# Patient Record
Sex: Female | Born: 1980 | Race: Asian | Hispanic: No | Marital: Married | Smoking: Never smoker
Health system: Southern US, Community
[De-identification: ages and names within clinical notes are randomized; demographics above are authoritative.]

## PROBLEM LIST (undated history)

## (undated) DIAGNOSIS — H469 Unspecified optic neuritis: Secondary | ICD-10-CM

## (undated) DIAGNOSIS — N83209 Unspecified ovarian cyst, unspecified side: Secondary | ICD-10-CM

## (undated) DIAGNOSIS — K3184 Gastroparesis: Secondary | ICD-10-CM

## (undated) HISTORY — PX: TONSILLECTOMY: SUR1361

## (undated) HISTORY — DX: Unspecified optic neuritis: H46.9

## (undated) HISTORY — DX: Gastroparesis: K31.84

---

## 2003-06-01 HISTORY — PX: TUBAL LIGATION: SHX77

## 2003-07-25 ENCOUNTER — Ambulatory Visit (HOSPITAL_COMMUNITY): Admission: RE | Admit: 2003-07-25 | Discharge: 2003-07-25 | Payer: Self-pay | Admitting: *Deleted

## 2003-09-02 ENCOUNTER — Ambulatory Visit (HOSPITAL_COMMUNITY): Admission: RE | Admit: 2003-09-02 | Discharge: 2003-09-02 | Payer: Self-pay | Admitting: *Deleted

## 2004-01-17 ENCOUNTER — Ambulatory Visit (HOSPITAL_COMMUNITY): Admission: RE | Admit: 2004-01-17 | Discharge: 2004-01-17 | Payer: Self-pay | Admitting: *Deleted

## 2004-01-28 ENCOUNTER — Ambulatory Visit: Payer: Self-pay | Admitting: Obstetrics and Gynecology

## 2004-01-28 ENCOUNTER — Encounter (INDEPENDENT_AMBULATORY_CARE_PROVIDER_SITE_OTHER): Payer: Self-pay | Admitting: Specialist

## 2004-01-28 ENCOUNTER — Inpatient Hospital Stay (HOSPITAL_COMMUNITY): Admission: AD | Admit: 2004-01-28 | Discharge: 2004-01-30 | Payer: Self-pay | Admitting: *Deleted

## 2006-05-31 DIAGNOSIS — K3184 Gastroparesis: Secondary | ICD-10-CM

## 2006-05-31 HISTORY — DX: Gastroparesis: K31.84

## 2007-01-07 ENCOUNTER — Emergency Department: Payer: Self-pay | Admitting: Emergency Medicine

## 2007-01-09 ENCOUNTER — Inpatient Hospital Stay (HOSPITAL_COMMUNITY): Admission: AD | Admit: 2007-01-09 | Discharge: 2007-01-15 | Payer: Self-pay | Admitting: Internal Medicine

## 2007-01-09 ENCOUNTER — Ambulatory Visit: Payer: Self-pay | Admitting: Family Medicine

## 2007-01-25 ENCOUNTER — Ambulatory Visit: Payer: Self-pay | Admitting: Internal Medicine

## 2010-10-13 NOTE — H&P (Signed)
[name], [name]                ACCOUNT NO.:  [id number]   MEDICAL RECORD NO.:  [id number]          PATIENT TYPE:  INP   LOCATION:  5705                         FACILITY:  [facility]   PHYSICIAN:  Leighton Roach McDiarmid, M.D.DATE OF BIRTH:  August 13, 1980   DATE OF ADMISSION:  01/09/2007  DATE OF DISCHARGE:                              HISTORY & PHYSICAL   PRIMARY CARE PHYSICIAN:  [facility]   CHIEF COMPLAINT:  Nausea and vomiting.   HISTORY OF PRESENT ILLNESS:  Patient is a 30 year old female who was  admitted for two weeks of nausea, vomiting and dizziness.  Symptoms  first began two weeks ago and included constant nausea and vomiting; it  was accompanied by a lightheaded feeling that is persistent, slightly  improved, but not relieved by lying down.  The emesis is anywhere from  light and frothy to mustard colored, but no blood.  Patient went to  [place] eight days ago, had a UA that was within normal limits and a CBC  that was normal except  for platelets of 135.  She was given Zofran and  found to be H. pylori-positive.  On August 6, she went back to [place]  for followup; she felt somewhat better, was started on a Pred-Pak for  the H. pylori.  When she went home, she began to experience nausea and  vomiting again; she stopped taking her Pred-Pak after the first dose.  Five days later on August 8, patient went to [facility] ED for continued  nausea and vomiting; she received Zofran plus three liters of IV fluids.  At this time, she had a KUB that showed a nonobstructive bowel gas  pattern and a moderate amount of stool descending in the colon.  Again,  all of her labs were within normal limits with the exception of t bili  of 1.1 and platelets of 84.  On the following day August 9, the patient  was not improved so she went back to [place] and got IM Phenergan.  At  this point, she felt better, was able to take some p.o., but when she  went home her nausea and vomiting resumed.  She was unable to  take the  p.o. Phenergan that was prescribed.  Phenergan per rectum was  prescribed, however this did not help either.  On August 11 today, she  went back to [place], had a UA that was suggestive of dehydration with a  specific gravity of 1.030, 30 of protein, 40 ketones and urobilinogen of  2.0 with a micro within normal limits.  She also had a negative urine  pregnancy test.  She was transferred here for hydration, antiemetics and  workup for her nausea and vomiting.  Of note, the patient was without  bowel movements for two weeks and with very little flatus.   REVIEW OF SYSTEMS:  Patient has mild abdominal pain, but no dysuria,  sick contact, no diarrhea, melena or chest pain, no fevers or chills.   PAST MEDICAL HISTORY:  None except for the H. pylori diagnosed last  week.   MEDICINES:  None except for recent treatment  of nausea and Pred-Pak for  H. pylori which she was unable to take.   ALLERGIES:  No known drug allergies.   PAST SURGICAL HISTORY:  Bilateral tubal ligation 2005.   FAMILY MEDICAL HISTORY:  Mother with lung cancer died at age 65, was a  nonsmoker and a half-sister with renal failure; patient is not sure to  the cause of the renal failure; half-sister also has some identified  thyroid problem.   SOCIAL HISTORY:  Patient works at [employer], not able to work for the past  two weeks.  She lives with her husband and her two kids, smokes one pack  of cigarettes per week, drinks alcohol approximately three times per  week, reports that she did try marijuana once earlier on in the course  of this nausea, but otherwise uses no illicit drugs.   PHYSICAL EXAMINATION:  VITAL SIGNS:  Temp 99.3, pulse 73, respirations  14, blood pressure 101/63, oxygen saturation 97% on room air.  Of note,  orthostatics at [facility] were positive today with pulse increasing from 70  to 101 from lying to standing.  GENERAL:  Patient in no acute distress, but had already had Zofran by  the time we  saw her.  HEENT:  Pupils equally round and reactive to light.  Extraocular  movements intact.  No lymphadenopathy.  Oropharynx clear.  Relatively  moist mucous membranes although she had had one liter of fluid by the  time we saw her.  CARDIOVASCULAR:  Regular rate and rhythm.  No murmurs, rubs or gallops.  PULMONARY:  Clear to auscultation bilaterally.  ABDOMEN:  Patient is very thin.  Hypoactive bowel sounds, but soft and  nontender on exam.  EXTREMITIES:  No clubbing, cyanosis or edema.  Capillary refill about  one to two seconds.   Labs are pending.   ASSESSMENT/PLAN:  Patient is a 30 year old female with positive H.  pylori untreated.  She is here for two weeks of nausea and vomiting.   1. Nausea and vomiting.  Most likely secondary to H. Pylori,      obstruction is another consideration.  Other possibilities that      seem less likely include pelvic inflammatory disease or      gastroenteritis, but patient without white blood cell count, no      fever, no abdominal tenderness and no sick contacts.  Patient with      positive H. pylori per [facility] records, started on Pred-Pak, but      only able to tolerate two doses and then self-discontinued.      Patient had a KUB at [facility] that showed a nonobstructive bowel      gas pattern, but patient is without stool for two weeks except for      one pellet-like stool after her Phenergan suppository was given.      Will check an abdominal two-view to look for obstruction.  Will      give Zofran and if needed Phenergan p.r.n., placed on Protonix 40      IV daily, given IV fluids, two one-liter boluses, and then will      keep her at 1 1/2 times maintenance about 150 mL an hour.  Will      check a B-Met and CBC.  Will repeat UA and urine cultures.  Patient      with bilateral tubal ligation and negative urine pregnancy test at      [facility] so pregnancy is very unlikely.  Right now will keep  her on      clears.  When symptoms improved, will  advance diet and resume H.      pylori treatment.  2. Decreased platelets.  Prior laboratories at outside lab showed      decreased platelets at 135 and then later at 84.  Will recheck a      CBC.  It is possible that patient does have some viral illness      which can reduce platelets.  3. H. pylori-positive.  This may be the cause of her symptoms.  Will      dry to re-hydrate and control nausea and vomiting first and restart      tripple therapy for H. pylori.      [name], MD  Electronically Signed      Leighton Roach McDiarmid, M.D.  Electronically Signed    SO/MEDQ  D:  01/10/2007  T:  01/10/2007  Job:  [id number]

## 2010-10-13 NOTE — Consult Note (Signed)
[name], [name] NO.:  [id number]   MEDICAL RECORD NO.:  [id number]          PATIENT TYPE:  INP   LOCATION:  5705                         FACILITY:  [facility]   PHYSICIAN:  Jordan Hawks. Elnoria Howard, MD    DATE OF BIRTH:  May 12, 1981   DATE OF CONSULTATION:  01/10/2007  DATE OF DISCHARGE:                                 CONSULTATION   REASON FOR CONSULTATION:  Nausea, vomiting and dehydration.   PRIMARY CARE Sky Borboa:  [facility].   REFERRING PHYSICIAN:  Teaching service.   HISTORY OF PRESENT ILLNESS:  This is a 30 year old female with a past  medical history of tubal ligation in 2005 who was admitted to the  hospital with nausea, vomiting and dehydration.  The patient states that  her symptoms started acutely approximately 2 weeks ago. Prior to that  time she denies taking any new medications or herbal supplementations.  She subsequently sought care at [facility] and she was treated  symptomatically with Zofran and blood work was obtained at that time.  She did improve symptomatically and her blood work was positive for H.  pylori. Because of the positive finding, she was started on a Prevpac  regimen and subsequently she started to have vomiting again. Because of  the intractability of her symptoms, she subsequently presented to the  emergency room at [facility] where she was dropping off  her children at her relatives. A workup there was negative and she was  treated symptomatically which helped to control her symptoms.  Subsequently she was discharged from that facility. The patient that  started to have recurrence of her nausea and vomiting and she returned  back to [facility] and obtained an antiemetic and felt better  after receiving the medication.  Unfortunately when she arrived home,  her symptoms started to recur. Because of persistence of her symptoms,  she was admitted to the hospital for further evaluation and  treatment.  She does report a 10-15 pound weight loss with her nausea and vomiting.  Beta HCGs have been performed which were negative.   PAST MEDICAL/SURGICAL HISTORY:  As stated above.   FAMILY HISTORY:  Noncontributory.   SOCIAL HISTORY:  Social alcohol use.  Positive for tobacco one pack per  day.   FAMILY HISTORY:  Noncontributory.   ALLERGIES:  No known drug allergies.   REVIEW OF SYSTEMS:  As stated above in the history of present illness  otherwise negative.   PHYSICAL EXAMINATION:  VITAL SIGNS:  Blood pressure is 118/73, heart  rate 54, respirations 16, temperature is 98.5, pulse ox is 100%.  GENERAL:  The patient is in no acute distress but she is uncomfortable  appearing.  HEENT:  Normocephalic, atraumatic.  Extraocular muscles intact.  Pupils  equal and round, reactive to light.  NECK:  Supple.  No lymphadenopathy.  LUNGS:  Clear to auscultation bilaterally.  CARDIOVASCULAR:  Regular rate and rhythm.  ABDOMEN:  Flat, soft, mild soreness in the in the upper rectus abdominis  muscles.  EXTREMITIES:  No clubbing, cyanosis or edema.   LABORATORY  VALUES:  White blood cell count is 4.9, hemoglobin 11.7,  platelets at 222.  Sodium is 136, potassium 3.8, chloride is 106, CO2  25, BUN is 12, creatinine 0.8.  Glucose at 88.  Amylase is 215, lipase  53, AST 14, ALT 11, alk phos 42, total bilirubin is 1.1.   IMPRESSION:  1. Nausea and vomiting.  2. Dehydration.  After evaluation of the patient, I believe an EGD is      required for further workup.  I am unclear about the etiology of      her symptoms.  Patients can present with idiopathic gastroparesis      with resultant nausea and vomiting. It does not appear that her      symptoms truly resolved, it was only exacerbated with the Prevpac      regimen, which she subsequently discontinued after one to two      doses. She may have had an exacerbation even if she did not take      the medication. I  do not believe her  symptoms are as a result of      H.  Pylori but biopsies will be obtained during the endoscopy.   PLAN:  The plan at this time is to perform an EGD.  The patient was also  found to be heme-positive and an upper GI source can be the cause.      Jordan Hawks Elnoria Howard, MD  Electronically Signed     PDH/MEDQ  D:  01/10/2007  T:  01/11/2007  Job:  [id number]   cc:   [facility]

## 2010-10-16 NOTE — Discharge Summary (Signed)
Mary, Evans NO.:  [id number]   MEDICAL RECORD NO.:  [id number]          PATIENT TYPE:  INP   LOCATION:  5705                         FACILITY:  [facility]   PHYSICIAN:  [name], M.D.DATE OF BIRTH:  24-Dec-1980   DATE OF ADMISSION:  01/09/2007  DATE OF DISCHARGE:  01/15/2007                               DISCHARGE SUMMARY   PRIMARY CARE [name]:  [facility].   CONSULTANT:  GI, Dr. Elnoria Evans.   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  The patient had about 78% of stomach contents left after 2  hours, normal is less than 30%.  EGD also showed delayed gastric  emptying.   LABORATORIES ON ADMISSION:  Patient's CBC showed white blood cells 5.9,  hemoglobin 12.4, hematocrit 36.2, platelets 65.  CMP showed sodium 136,  potassium 3.8, chloride 106, bicarb 25, glucose 88, BUN 12, creatinine  0.89, bilirubin 1.1, alk phos 42.  Other LFTs were within normal limits.  Lipase was within normal limits at 54.  Amylase was slightly elevated at  312.  Urinalysis was significant only for trace blood, 40 ketones.  The  patient was found to be fecal occult blood negative.  Urine pregnancy  test was negative.   DISCHARGE MEDICATIONS:  The patient was discharged on:  1. Reglan 5 mg p.o. q.a.c. and q.h.s.  2. Zofran 4 mg p.o. q.6h. for nausea as needed.  3. Protonix 40 mg p.o. daily, to be taken until she has her outpatient      appointment with Dr. Elnoria Evans.   REASON FOR ADMISSION:  Nausea and vomiting x2 weeks.   HOSPITAL COURSE:  Problem:  1. Nausea and vomiting.  Patient did experience intractable nausea and      vomiting for several weeks, had been seen at [facility]      several times as well as [facility].  She was started      on Protonix 40 b.i.d., IV Zofran, and abdominal films were obtained      which showed a non-obstructive pattern.  GI was consulted.      Patient, I guess at earlier in the lab,  she was found to be fecal      occult blood negative; that was an error.  She was found to be      fecal occult blood positive.  GI was consulted.  An EGD was done      that showed delayed gastric emptying, no ulcers or other lesions.      The following day a gastric emptying study was done which also      showed delayed gastric emptying with the patient still retaining      78% of intake after 2 hours, normal is less than 30%.  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  She was placed on Reglan.  After several days on  Reglan, the patient felt considerably better, was using much less      of her antiemetics, was beginning to take p.o. better, and      eventually her diet was advanced to solids which she tolerated      well.  She was discharged on Reglan 5 mg q.a.c. and q.h.s. and      Zofran p.r.n., also on Protonix.  2. Decreased platelets.  Initially when the patient came in, she was      thought to have very low platelets at 65, and this was consistent      with past labs that were given to [place] by [facility]; however, when her      smear was reviewed, it showed platelets of 220.  Apparently the      patient has large platelets which were not counted accurately by      the machine, so a manual count showed platelets was within normal      limits.  These results were verified on subsequent CBCs, so no      action was taken.   PATIENT'S CONDITION AT THE TIME OF DISCHARGE:  Stable.   PENDING TEST RESULTS AT THE TIME OF DISCHARGE:  None.   DISPOSITION:  The patient was discharged home in stable condition.   DISCHARGE FOLLOWUP:  The patient is to follow up with [facility]  within a week of discharge.  She is also to follow up with Dr. Elnoria Evans on  August 25 at 10:15 a.m.   FOLLOWUP ISSUES:  The patient was discharged on Protonix, not clear if  this is  something she will need a long-term basis.  This can be decided  by Dr. Elnoria Evans.  Large platelets, doctor at [facility] can decide if any  further workup is needed for this.      Mary Muir, MD  Electronically Signed      [name], M.D.  Electronically Signed    SO/MEDQ  D:  01/18/2007  T:  01/18/2007  Job:  [id number]   cc:   [name]. Mary Howard, MD  [facility]

## 2010-10-16 NOTE — Op Note (Signed)
NAME:  [name], [name]                          ACCOUNT NO.:  [id number]   MEDICAL RECORD NO.:  [id number]                   PATIENT TYPE:  INP   LOCATION:  9113                                 FACILITY:  [facility]   PHYSICIAN:  Conni Elliot, M.D.             DATE OF BIRTH:  11-27-1980   DATE OF PROCEDURE:  01/28/2004  DATE OF DISCHARGE:                                 OPERATIVE REPORT   PREOPERATIVE DIAGNOSES:  Desire for surgical sterilization.   POSTOPERATIVE DIAGNOSES:  Desire for surgical sterilization.   OPERATION:  Modified bilateral [place] tubal ligation.   SURGEON:  Conni Elliot, M.D.   ANESTHESIA:  Continuous lumbar epidural.   FINDINGS:  There were significant adhesions on the right involving the right  fallopian tube; however, the left was clear.   DESCRIPTION OF PROCEDURE:  After placing the patient under continuous lumbar  epidural anesthetic, the patient supine, abdomen was prepped and draped in a  sterile fashion.  An umbilical incision was made. An incision made through  the skin and fascia, peritoneal cavity entered. The right fallopian tube was  identified with difficulty and found to have significant adhesions but was  able to bring the fimbriated end as well as significant portion of the  distal part of the tube into the operative field.  A segment of tube was  brought into the operative field, doubly suture ligated and approximately 2  cm segment excised.  Hemostasis was adequate. A similar procedure was done  on the left side, hemostasis again adequate. The anterior peritoneum,  fascia, and subcutaneous skin closed in adequate fashion.  Estimated blood  loss less than 50 mL.                                               Conni Elliot, M.D.    ASG/MEDQ  D:  01/28/2004  T:  01/28/2004  Job:  [id number]

## 2011-03-12 LAB — CBC
HCT: 34.6 — ABNORMAL LOW
Hemoglobin: 11.8 — ABNORMAL LOW
MCV: 90.7
Platelets: 140 — ABNORMAL LOW
RBC: 3.82 — ABNORMAL LOW
WBC: 5.1

## 2011-03-12 LAB — BASIC METABOLIC PANEL
BUN: <1 — ABNORMAL LOW
CO2: 24
CO2: 26
Calcium: 8.6
Chloride: 108
Chloride: 111
GFR calc Af Amer: >60
GFR calc non Af Amer: >60
Glucose, Bld: 102 — ABNORMAL HIGH
Potassium: 3.6
Potassium: 3.6
Sodium: 139
Sodium: 140

## 2011-03-15 LAB — COMPREHENSIVE METABOLIC PANEL
BUN: 12
Calcium: 8.1 — ABNORMAL LOW
Creatinine, Ser: 0.89
GFR calc non Af Amer: >60
Glucose, Bld: 88
Total Protein: 6.4

## 2011-03-15 LAB — BASIC METABOLIC PANEL
BUN: 2 — ABNORMAL LOW
BUN: <1 — ABNORMAL LOW
CO2: 27
Chloride: 109
Creatinine, Ser: 0.81
Creatinine, Ser: 0.91
GFR calc Af Amer: >60
GFR calc non Af Amer: >60
Glucose, Bld: 131 — ABNORMAL HIGH
Potassium: 3.2 — ABNORMAL LOW
Potassium: 3.3 — ABNORMAL LOW

## 2011-03-15 LAB — CBC
HCT: 34.2 — ABNORMAL LOW
HCT: 34.9 — ABNORMAL LOW
HCT: 36.2
HCT: 36.8
Hemoglobin: 11.7 — ABNORMAL LOW
Hemoglobin: 12.4
Hemoglobin: 12.9
MCHC: 34.4
MCV: 88.5
Platelets: 160
Platelets: 180
Platelets: 220
RBC: 3.83 — ABNORMAL LOW
RDW: 12.9
RDW: 13.1
RDW: 13.4
WBC: 4.2
WBC: 4.2

## 2011-03-15 LAB — DIFFERENTIAL
Basophils Absolute: 0
Basophils Relative: 1
Basophils Relative: 1
Eosinophils Absolute: 0
Eosinophils Relative: 0
Eosinophils Relative: 1
Lymphocytes Relative: 23
Lymphs Abs: 1.1
Lymphs Abs: 1.2
Monocytes Absolute: 0.3
Monocytes Absolute: 0.5
Monocytes Relative: 9
Neutro Abs: 2.3
Neutro Abs: 2.6

## 2011-03-15 LAB — URINE CULTURE
Colony Count: NO GROWTH
Culture: NO GROWTH

## 2011-03-15 LAB — URINALYSIS, MICROSCOPIC ONLY
Glucose, UA: NEGATIVE
Leukocytes, UA: NEGATIVE
Specific Gravity, Urine: 1.024
Urobilinogen, UA: 1

## 2011-03-15 LAB — LIPASE, BLOOD
Lipase: 53
Lipase: 54

## 2011-03-15 LAB — OCCULT BLOOD X 1 CARD TO LAB, STOOL: Fecal Occult Bld: NEGATIVE

## 2011-03-15 LAB — SAVE SMEAR

## 2013-04-20 ENCOUNTER — Emergency Department (HOSPITAL_COMMUNITY)
Admission: EM | Admit: 2013-04-20 | Discharge: 2013-04-20 | Disposition: A | Discharge status: Home / Self Care | Payer: 59 | Source: Home / Self Care | Attending: Family Medicine | Admitting: Family Medicine

## 2013-04-20 ENCOUNTER — Encounter (HOSPITAL_COMMUNITY): Payer: Self-pay | Admitting: Emergency Medicine

## 2013-04-20 DIAGNOSIS — J069 Acute upper respiratory infection, unspecified: Secondary | ICD-10-CM

## 2013-04-20 DIAGNOSIS — J00 Acute nasopharyngitis [common cold]: Secondary | ICD-10-CM

## 2013-04-20 HISTORY — DX: Unspecified ovarian cyst, unspecified side: N83.209

## 2013-04-20 LAB — POCT URINALYSIS DIP (DEVICE)
Bilirubin Urine: NEGATIVE
Hgb urine dipstick: NEGATIVE
Ketones, ur: NEGATIVE mg/dL
Leukocytes, UA: NEGATIVE
pH: 8.5 — ABNORMAL HIGH (ref 5.0–8.0)

## 2013-04-20 MED ORDER — PSEUDOEPHEDRINE-IBUPROFEN 30-200 MG PO CAPS: ORAL_CAPSULE | ORAL | Status: DC

## 2013-04-20 MED ORDER — METHYLPREDNISOLONE 4 MG PO KIT: PACK | ORAL | Status: DC

## 2013-04-20 MED ORDER — ALBUTEROL SULFATE HFA 108 (90 BASE) MCG/ACT IN AERS: 2.0000 | INHALATION_SPRAY | RESPIRATORY_TRACT | Status: DC | PRN

## 2013-04-20 MED ORDER — HYDROCOD POLST-CHLORPHEN POLST 10-8 MG/5ML PO LQCR: 5.0000 mL | Freq: Two times a day (BID) | ORAL | Status: DC | PRN

## 2013-04-20 MED ORDER — CHLORPHENIRAMINE-PSE-IBUPROFEN 2-30-200 MG PO TABS: ORAL_TABLET | ORAL | Status: DC

## 2013-04-20 NOTE — ED Provider Notes (Signed)
CSN: [id number]     Arrival date & time 04/20/13  9562 History   First MD Initiated Contact with Patient 04/20/13 [phone]     Chief Complaint  Patient presents with  . Sore Throat   (Consider location/radiation/quality/duration/timing/severity/associated sxs/prior Treatment) HPI Comments: 32 year old female presents complaining of possible strep throat. This began on Tuesday night, 3 days ago. On Tuesday, she had sore throat, headache, nausea, fever of 101. The sore throat has persisted and has gotten worse. Starting last night she began to have a cough that has been productive of sputum with some bloody streaks in it. She has been taking over-the-counter cough and cold medicines without relief of her symptoms. Shortness of breath  Patient is a 32 y.o. female presenting with pharyngitis.  Sore Throat Pertinent negatives include no chest pain, no abdominal pain and no shortness of breath.    Past Medical History  Diagnosis Date  . Ovarian cyst    Past Surgical History  Procedure Laterality Date  . Tubal ligation    . Tonsillectomy     No family history on file. History  Substance Use Topics  . Smoking status: Never Smoker   . Smokeless tobacco: Not on file  . Alcohol Use: No   OB History   Grav Para Term Preterm Abortions TAB SAB Ect Mult Living                 Review of Systems  Constitutional: Positive for fever, chills and fatigue.  HENT: Positive for sore throat.   Eyes: Negative for visual disturbance.  Respiratory: Positive for cough. Negative for shortness of breath.        Hemoptysis   Cardiovascular: Negative for chest pain, palpitations and leg swelling.  Gastrointestinal: Positive for nausea. Negative for vomiting and abdominal pain.  Endocrine: Negative for polydipsia and polyuria.  Genitourinary: Negative for dysuria, urgency and frequency.  Musculoskeletal: Negative for arthralgias and myalgias.  Skin: Negative for rash.  Neurological: Negative for  dizziness, weakness and light-headedness.    Allergies  Review of patient's allergies indicates no known allergies.  Home Medications   Current Outpatient Rx  Name  Route  Sig  Dispense  Refill  . albuterol (PROVENTIL HFA;VENTOLIN HFA) 108 (90 BASE) MCG/ACT inhaler   Inhalation   Inhale 2 puffs into the lungs every 4 (four) hours as needed (for cough).   1 Inhaler   0   . chlorpheniramine-HYDROcodone (TUSSIONEX PENNKINETIC ER) 10-8 MG/5ML LQCR   Oral   Take 5 mLs by mouth every 12 (twelve) hours as needed for cough.   115 mL   0   . Chlorpheniramine-PSE-Ibuprofen (ADVIL ALLERGY SINUS) 2-30-200 MG TABS      1-2 tabs PO Q4-6 hrs PRN   50 each   1   . methylPREDNISolone (MEDROL DOSEPAK) 4 MG tablet      follow package directions   21 tablet   0     Dispense as written.    BP 128/79  Pulse 88  Temp(Src) 99.5 F (37.5 C) (Oral)  Resp 16  SpO2 99%  LMP 04/10/2013 Physical Exam  Nursing note and vitals reviewed. Constitutional: She is oriented to person, place, and time. Vital signs are normal. She appears well-developed and well-nourished. No distress.  HENT:  Head: Normocephalic and atraumatic.  Right Ear: External ear normal.  Left Ear: External ear normal.  Mouth/Throat: Oropharynx is clear and moist. No oropharyngeal exudate.  Eyes: Conjunctivae and EOM are normal. Pupils are equal, round, and reactive  to light. Right eye exhibits no discharge. Left eye exhibits no discharge. No scleral icterus.  Neck: Normal range of motion. Neck supple. No JVD present.  Cardiovascular: Normal rate, regular rhythm and normal heart sounds.  Exam reveals no gallop and no friction rub.   No murmur heard. Pulmonary/Chest: Effort normal and breath sounds normal. No respiratory distress. She has no wheezes. She has no rales.  Abdominal: Soft. There is no tenderness.  Lymphadenopathy:    She has no cervical adenopathy.  Neurological: She is alert and oriented to person, place, and  time. She has normal strength. Coordination normal.  Skin: Skin is warm and dry. No rash noted. She is not diaphoretic.  Psychiatric: She has a normal mood and affect. Judgment normal.    ED Course  Procedures (including critical care time) Labs Review Labs Reviewed  POCT URINALYSIS DIP (DEVICE) - Abnormal; Notable for the following:    pH 8.5 (*)    All other components within normal limits  POCT RAPID STREP A ([facility] ONLY)   Imaging Review No results found.    MDM   1. Acute nasopharyngitis (common cold)   2. URI (upper respiratory infection)    Treating symptomatically. Followup if not improving.    Meds ordered this encounter  Medications  . albuterol (PROVENTIL HFA;VENTOLIN HFA) 108 (90 BASE) MCG/ACT inhaler    Sig: Inhale 2 puffs into the lungs every 4 (four) hours as needed (for cough).    Dispense:  1 Inhaler    Refill:  0    Order Specific Question:  Supervising Provider    Answer:  Bradd Canary D [id number]  . chlorpheniramine-HYDROcodone (TUSSIONEX PENNKINETIC ER) 10-8 MG/5ML LQCR    Sig: Take 5 mLs by mouth every 12 (twelve) hours as needed for cough.    Dispense:  115 mL    Refill:  0    Order Specific Question:  Supervising Provider    Answer:  Linna Hoff [phone]  . methylPREDNISolone (MEDROL DOSEPAK) 4 MG tablet    Sig: follow package directions    Dispense:  21 tablet    Refill:  0    Order Specific Question:  Supervising Provider    Answer:  Linna Hoff [phone]  . Pseudoephedrine-Ibuprofen 30-200 MG CAPS    Sig: 2 tablets by mouth every 6 hours when necessary    Dispense:  50 each    Refill:  1    Order Specific Question:  Supervising Provider    Answer:  Bradd Canary D [[id number]]     Graylon Good, PA-C 04/20/13 [phone]

## 2013-04-20 NOTE — ED Notes (Signed)
Pt c/o poss strep/sore throat onset Tuesday night Reports a fever of 101, HA, odynophagia, chills, nauseas Denies: v/d, SOB, wheezing Alert w/no signs of acute distress.

## 2013-04-20 NOTE — ED Provider Notes (Signed)
Medical screening examination/treatment/procedure(s) were performed by resident physician or non-physician practitioner and as supervising physician I was immediately available for consultation/collaboration.   Tywon Niday DOUGLAS MD.   Aribella Vavra D Ion Gonnella, MD 04/20/13 [id number] 

## 2013-04-22 LAB — CULTURE, GROUP A STREP

## 2013-07-19 ENCOUNTER — Ambulatory Visit: Payer: 59 | Admitting: Family Medicine

## 2013-08-02 ENCOUNTER — Ambulatory Visit (INDEPENDENT_AMBULATORY_CARE_PROVIDER_SITE_OTHER): Payer: 59 | Admitting: Family Medicine

## 2013-08-02 ENCOUNTER — Encounter: Payer: Self-pay | Admitting: Family Medicine

## 2013-08-02 ENCOUNTER — Emergency Department (HOSPITAL_COMMUNITY)
Admission: EM | Admit: 2013-08-02 | Discharge: 2013-08-02 | Disposition: A | Discharge status: Home / Self Care | Payer: 59 | Attending: Emergency Medicine | Admitting: Emergency Medicine

## 2013-08-02 ENCOUNTER — Encounter (HOSPITAL_COMMUNITY): Payer: Self-pay | Admitting: Emergency Medicine

## 2013-08-02 VITALS — BP 118/68 | HR 72 | Ht 61.5 in | Wt 95.6 lb

## 2013-08-02 DIAGNOSIS — Z8719 Personal history of other diseases of the digestive system: Secondary | ICD-10-CM | POA: Insufficient documentation

## 2013-08-02 DIAGNOSIS — J302 Other seasonal allergic rhinitis: Secondary | ICD-10-CM

## 2013-08-02 DIAGNOSIS — IMO0002 Reserved for concepts with insufficient information to code with codable children: Secondary | ICD-10-CM

## 2013-08-02 DIAGNOSIS — Z79899 Other long term (current) drug therapy: Secondary | ICD-10-CM | POA: Insufficient documentation

## 2013-08-02 DIAGNOSIS — R51 Headache: Secondary | ICD-10-CM | POA: Insufficient documentation

## 2013-08-02 DIAGNOSIS — Z3202 Encounter for pregnancy test, result negative: Secondary | ICD-10-CM | POA: Insufficient documentation

## 2013-08-02 DIAGNOSIS — J309 Allergic rhinitis, unspecified: Secondary | ICD-10-CM

## 2013-08-02 DIAGNOSIS — Z8742 Personal history of other diseases of the female genital tract: Secondary | ICD-10-CM | POA: Insufficient documentation

## 2013-08-02 LAB — URINALYSIS, ROUTINE W REFLEX MICROSCOPIC
BILIRUBIN URINE: NEGATIVE
GLUCOSE, UA: NEGATIVE mg/dL
Hgb urine dipstick: NEGATIVE
KETONES UR: NEGATIVE mg/dL
LEUKOCYTES UA: NEGATIVE
NITRITE: NEGATIVE
PH: 7 (ref 5.0–8.0)
Protein, ur: NEGATIVE mg/dL
SPECIFIC GRAVITY, URINE: 1.006 (ref 1.005–1.030)
Urobilinogen, UA: 0.2 mg/dL (ref 0.0–1.0)

## 2013-08-02 MED ORDER — METRONIDAZOLE 500 MG PO TABS
ORAL_TABLET | ORAL | Status: AC
  Administered 2013-08-02: 2000 mg via ORAL
  Filled 2013-08-02: qty 4

## 2013-08-02 MED ORDER — METRONIDAZOLE 500 MG PO TABS
2000.0000 mg | ORAL_TABLET | Freq: Once | ORAL | Status: AC
  Administered 2013-08-02: 2000 mg via ORAL

## 2013-08-02 MED ORDER — CEFIXIME 400 MG PO TABS
ORAL_TABLET | ORAL | Status: AC
  Administered 2013-08-02: 400 mg via ORAL
  Filled 2013-08-02: qty 1

## 2013-08-02 MED ORDER — LEVONORGESTREL 0.75 MG PO TABS
ORAL_TABLET | ORAL | Status: AC
  Filled 2013-08-02: qty 2

## 2013-08-02 MED ORDER — CEFTRIAXONE SODIUM 250 MG IJ SOLR
250.0000 mg | Freq: Once | INTRAMUSCULAR | Status: AC
  Administered 2013-08-02: 250 mg via INTRAMUSCULAR
  Filled 2013-08-02: qty 250

## 2013-08-02 MED ORDER — PROMETHAZINE HCL 25 MG PO TABS
ORAL_TABLET | ORAL | Status: AC
  Administered 2013-08-02: 25 mg via ORAL
  Filled 2013-08-02: qty 3

## 2013-08-02 MED ORDER — AZITHROMYCIN 1 G PO PACK
PACK | ORAL | Status: AC
  Administered 2013-08-02: 1 g via ORAL
  Filled 2013-08-02: qty 1

## 2013-08-02 MED ORDER — LIDOCAINE HCL (PF) 1 % IJ SOLN
INTRAMUSCULAR | Status: AC
  Administered 2013-08-02: 14:00:00
  Filled 2013-08-02: qty 5

## 2013-08-02 MED ORDER — PROMETHAZINE HCL 25 MG PO TABS
25.0000 mg | ORAL_TABLET | Freq: Four times a day (QID) | ORAL | Status: DC | PRN
  Administered 2013-08-02: 25 mg via ORAL
  Filled 2013-08-02: qty 1

## 2013-08-02 MED ORDER — AZITHROMYCIN 1 G PO PACK
1.0000 g | PACK | Freq: Once | ORAL | Status: AC
  Administered 2013-08-02: 1 g via ORAL

## 2013-08-02 MED ORDER — CEFIXIME 400 MG PO TABS
400.0000 mg | ORAL_TABLET | Freq: Once | ORAL | Status: AC
  Administered 2013-08-02: 400 mg via ORAL
  Filled 2013-08-02: qty 1
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
## 2013-08-02 NOTE — ED Provider Notes (Signed)
Medical screening examination/treatment/procedure(s) were performed by non-physician practitioner and as supervising physician I was immediately available for consultation/collaboration.   EKG Interpretation None        Orpah Greek, MD 08/02/13 [phone]
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
## 2013-08-02 NOTE — ED Notes (Signed)
SANE nurse at bedside.
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
## 2013-08-03 DIAGNOSIS — IMO0002 Reserved for concepts with insufficient information to code with codable children: Secondary | ICD-10-CM | POA: Insufficient documentation

## 2013-08-03 LAB — POC URINE PREG, ED: PREG TEST UR: NEGATIVE
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
## 2013-09-23 ENCOUNTER — Encounter (HOSPITAL_COMMUNITY): Payer: Self-pay | Admitting: Emergency Medicine

## 2013-09-23 ENCOUNTER — Observation Stay (HOSPITAL_COMMUNITY)
Admission: EM | Admit: 2013-09-23 | Discharge: 2013-09-25 | Disposition: A | Discharge status: Home / Self Care | Payer: 59 | Attending: Family Medicine | Admitting: Family Medicine

## 2013-09-23 ENCOUNTER — Emergency Department (HOSPITAL_COMMUNITY): Payer: 59

## 2013-09-23 DIAGNOSIS — J301 Allergic rhinitis due to pollen: Secondary | ICD-10-CM | POA: Insufficient documentation

## 2013-09-23 DIAGNOSIS — H469 Unspecified optic neuritis: Principal | ICD-10-CM | POA: Insufficient documentation

## 2013-09-23 DIAGNOSIS — R51 Headache: Secondary | ICD-10-CM | POA: Insufficient documentation

## 2013-09-23 DIAGNOSIS — H547 Unspecified visual loss: Secondary | ICD-10-CM

## 2013-09-23 DIAGNOSIS — D473 Essential (hemorrhagic) thrombocythemia: Secondary | ICD-10-CM | POA: Insufficient documentation

## 2013-09-23 DIAGNOSIS — G935 Compression of brain: Secondary | ICD-10-CM | POA: Insufficient documentation

## 2013-09-23 DIAGNOSIS — J302 Other seasonal allergic rhinitis: Secondary | ICD-10-CM

## 2013-09-23 DIAGNOSIS — H5340 Unspecified visual field defects: Secondary | ICD-10-CM

## 2013-09-23 LAB — COMPREHENSIVE METABOLIC PANEL
ALK PHOS: 89 U/L (ref 39–117)
ALT: 10 U/L (ref 0–35)
AST: 18 U/L (ref 0–37)
Albumin: 3.8 g/dL (ref 3.5–5.2)
BUN: 10 mg/dL (ref 6–23)
CHLORIDE: 103 meq/L (ref 96–112)
CO2: 25 mEq/L (ref 19–32)
Calcium: 9.1 mg/dL (ref 8.4–10.5)
Creatinine, Ser: 0.83 mg/dL (ref 0.50–1.10)
GFR calc non Af Amer: >90 mL/min (ref >90)
GLUCOSE: 68 mg/dL — AB (ref 70–99)
POTASSIUM: 4 meq/L (ref 3.7–5.3)
Sodium: 140 mEq/L (ref 137–147)
Total Bilirubin: 0.5 mg/dL (ref 0.3–1.2)
Total Protein: 7.7 g/dL (ref 6.0–8.3)

## 2013-09-23 LAB — CBC WITH DIFFERENTIAL/PLATELET
Basophils Absolute: 0 10*3/uL (ref 0.0–0.1)
Basophils Relative: 0 % (ref 0–1)
Eosinophils Absolute: 0.2 10*3/uL (ref 0.0–0.7)
Eosinophils Relative: 3 % (ref 0–5)
HCT: 43.1 % (ref 36.0–46.0)
HEMOGLOBIN: 13.8 g/dL (ref 12.0–15.0)
LYMPHS ABS: 1.6 10*3/uL (ref 0.7–4.0)
Lymphocytes Relative: 25 % (ref 12–46)
MCH: 30 pg (ref 26.0–34.0)
MCHC: 32 g/dL (ref 30.0–36.0)
MCV: 93.7 fL (ref 78.0–100.0)
Monocytes Absolute: 0.6 10*3/uL (ref 0.1–1.0)
Monocytes Relative: 9 % (ref 3–12)
NEUTROS ABS: 3.8 10*3/uL (ref 1.7–7.7)
NEUTROS PCT: 62 % (ref 43–77)
Platelets: 113 10*3/uL — ABNORMAL LOW (ref 150–400)
RBC: 4.6 MIL/uL (ref 3.87–5.11)
RDW: 14 % (ref 11.5–15.5)
WBC: 6.1 10*3/uL (ref 4.0–10.5)

## 2013-09-23 LAB — TROPONIN I

## 2013-09-23 LAB — PROTIME-INR
INR: 1.04 (ref 0.00–1.49)
Prothrombin Time: 13.4 seconds (ref 11.6–15.2)

## 2013-09-23 LAB — POC URINE PREG, ED: Preg Test, Ur: NEGATIVE

## 2013-09-23 LAB — APTT: aPTT: 23 seconds — ABNORMAL LOW (ref 24–37)

## 2013-09-23 NOTE — ED Notes (Signed)
Pt reports progressively worsening right eye vision x 4 days. States "it is a band in the upper half of my vision that is gray/black." States that initially her vision was 25% reduced on Thursday, and that area has progressively worsened. Pt reports mild HA, and states "the muscle around my eye are sore." Pt in NAD.

## 2013-09-23 NOTE — Consult Note (Signed)
Reason for Consult:Visual changes Referring Physician: [place]  CC: Vision loss  HPI: [name] is an 33 y.o. female who reports headaches for the past week.  This is unusual for her since she does not really have headaches.  Reports that on Saturday she awakened with blurry vision that she was able to attribute to loss of vision superiorly in her right eye.  When she awakened today it was somewhat worse.  She saw her optometrist who did visual fields confirming the field cut and did an eye exam noting to intraocular abnormalities or evidence of retinal tears.  Patient was referred to the ED at that time.   Headache is on the right.  Has been relieved with Excedrin but returns.  Described as sharp.  Currently at a 2/10. Has had no previous similar events.  Has had no previous episodic neurologic complaints to include paresthesias, difficulty with gait, bowel or bladder difficulties, weakness.  Past Medical History  Diagnosis Date  . Ovarian cyst   . 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 Tubal ligation  2005  . Tonsillectomy      Family history: Father alive and well.  Mother deceased with adenocarcinoma of the lung.  Sister with end stage renal disease.  Social History:  reports that she has never smoked. She does not have any smokeless tobacco history on file. She reports that she does not drink alcohol or use illicit drugs.  No Known Allergies  Medications: I have reviewed the patient's current medications. Prior to Admission:   Current outpatient prescriptions:Aspirin-Acetaminophen-Caffeine (EXCEDRIN MIGRAINE PO), Take 1 tablet by mouth daily., Disp: , Rfl: ;   Multiple Vitamin (MULTIVITAMIN) tablet, Take 1 tablet by mouth daily., Disp: , Rfl:   ROS: History obtained from the patient  General ROS: negative for - chills, fatigue, fever, night sweats, weight gain or weight loss Psychological ROS:  negative for - behavioral disorder, hallucinations, memory difficulties, mood swings or suicidal ideation Ophthalmic ROS: as noted in HPI ENT ROS: negative for - epistaxis, nasal discharge, oral lesions, sore throat, tinnitus or vertigo Allergy and Immunology ROS: negative for - hives or itchy/watery eyes Hematological and Lymphatic ROS: negative for - bleeding problems, bruising or swollen lymph nodes Endocrine ROS: negative for - galactorrhea, hair pattern changes, polydipsia/polyuria or temperature intolerance Respiratory ROS: negative for - cough, hemoptysis, shortness of breath or wheezing Cardiovascular ROS: negative for - chest pain, dyspnea on exertion, edema or irregular heartbeat Gastrointestinal ROS: negative for - abdominal pain, diarrhea, hematemesis, nausea/vomiting or stool incontinence Genito-Urinary ROS: negative for - dysuria, hematuria, incontinence or urinary frequency/urgency Musculoskeletal ROS: negative for - joint swelling or muscular weakness Neurological ROS: as noted in HPI Dermatological ROS: negative for rash and skin lesion changes  Physical Examination: Blood pressure 116/59, pulse 83, temperature 98.3 F (36.8 C), temperature source Oral, resp. rate 18, height 5' 1"  (1.549 m), weight 44.453 kg (98 lb), last menstrual period 08/17/2013, SpO2 99.00%.  Neurologic Examination Mental Status: Alert, oriented, thought content appropriate.  Speech fluent without evidence of aphasia.  Able to follow 3 step commands without difficulty. Cranial Nerves: II: Discs flat bilaterally; Decreased vision superiorly in the right eye.  Vision otherwise intact.  Pupils equal, round, reactive to light and accommodation.  No APD III,IV, VI: ptosis not present, extra-ocular motions intact bilaterally with pain on looking to the left V,VII: smile symmetric, facial light touch sensation normal bilaterally VIII: hearing  normal bilaterally IX,X: gag reflex present XI: bilateral shoulder  shrug XII: midline tongue extension Motor: Right : Upper extremity   5/5    Left:     Upper extremity   5/5  Lower extremity   5/5     Lower extremity   5/5 Tone and bulk:normal tone throughout; no atrophy noted Sensory: Pinprick and light touch intact throughout, bilaterally Deep Tendon Reflexes: 2+ and symmetric throughout Plantars: Right: downgoing   Left: downgoing Cerebellar: normal finger-to-nose and normal heel-to-shin test Gait: normal gait and station CV: pulses palpable throughout     Laboratory Studies:   Basic Metabolic Panel:  Recent Labs Lab 09/23/13 1649  NA 140  K 4.0  CL 103  CO2 25  GLUCOSE 68*  BUN 10  CREATININE 0.83  CALCIUM 9.1    Liver Function Tests:  Recent Labs Lab 09/23/13 1649  AST 18  ALT 10  ALKPHOS 89  BILITOT 0.5  PROT 7.7  ALBUMIN 3.8   No results found for this basename: LIPASE, AMYLASE,  in the last 168 hours No results found for this basename: AMMONIA,  in the last 168 hours  CBC:  Recent Labs Lab 09/23/13 1649  WBC 6.1  NEUTROABS 3.8  HGB 13.8  HCT 43.1  MCV 93.7  PLT 113*    Cardiac Enzymes:  Recent Labs Lab 09/23/13 1649  TROPONINI <0.30    BNP: No components found with this basename: POCBNP,   CBG: No results found for this basename: GLUCAP,  in the last 168 hours  Microbiology: Results for orders placed during the hospital encounter of 04/20/13  CULTURE, GROUP A STREP     Status: None   Collection Time    04/20/13  9:06 AM      Result Value Ref Range Status   Specimen Description THROAT   Final   Special Requests NONE   Final   Culture     Final   Value: No Beta Hemolytic Streptococci Isolated     Performed at [facility]   Report Status 04/22/2013 FINAL   Final    Coagulation Studies:  Recent Labs  09/23/13 1649  LABPROT 13.4  INR 1.04    Urinalysis: No results found for this basename: COLORURINE, APPERANCEUR, LABSPEC, PHURINE, GLUCOSEU, HGBUR, BILIRUBINUR, KETONESUR,  PROTEINUR, UROBILINOGEN, NITRITE, LEUKOCYTESUR,  in the last 168 hours  Lipid Panel:  No results found for this basename: chol, trig, hdl, cholhdl, vldl, ldlcalc    HgbA1C:  No results found for this basename: HGBA1C    Urine Drug Screen:   No results found for this basename: labopia, cocainscrnur, labbenz, amphetmu, thcu, labbarb    Alcohol Level: No results found for this basename: ETH,  in the last 168 hours  Other results: EKG: sinus rhythm at 75 bpm.  Imaging: Mr Brain Wo Contrast  09/23/2013   CLINICAL DATA:  Visual field loss in the right superior visual field. Normal ophthalmological exam.  EXAM: MRI HEAD WITHOUT CONTRAST  TECHNIQUE: Multiplanar, multiecho pulse sequences of the brain and surrounding structures were obtained without intravenous contrast.  COMPARISON:  None.  FINDINGS: Cerebellar tonsils extend 7 mm through the foramen magnum consistent with mild Chiari malformation. There is no evidence of acquired brain pathology. No sign of old or acute infarction, mass lesion, hemorrhage, hydrocephalus or extra-axial collection. No evidence of demyelinating disease. No pituitary mass. No inflammatory sinus disease.  IMPRESSION: No acute infarction. No abnormality seen to explain visual field defect. No evidence of demyelinating disease.  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 This can be associated with headache and neurological symptoms, but I am not aware of it being related to visual field defect.   Electronically Signed   By: Nelson Chimes M.D.   On: 09/23/2013 19:59     Assessment/Plan: 33 year old female with visual changes.  Suspect an abnormality anterior to the chiasm.  MRI of the brain reviewed and shows no acute changes.  There is evidence of a Chiari I but this should not cause the patient's presenting symptoms.  Dilated exam showed normal intraocular findings.  Differential includes an orbital abnormality, MS,  TA, NAION, etc.  Further work up indicated.    Recommendations: 1.  MRI of the orbit with and without contrast 2.  CRP, TSH, ESR, Lyme titer 3.  Based on results of above, LP may be indicated as well.    Alexis Goodell, MD [facility] [phone] 09/23/2013, 11:24 PM

## 2013-09-23 NOTE — ED Provider Notes (Signed)
CSN: [id number]     Arrival date & time 09/23/13  1519 History   First MD Initiated Contact with Patient 09/23/13 1710     Chief Complaint  Patient presents with  . vision loss      (Consider location/radiation/quality/duration/timing/severity/associated sxs/prior Treatment) The history is provided by the patient. No language interpreter was used.  [name] is a 33 year old female with past medical history nondiabetic gastroparesis, ovarian cysts currently on Janel birth control pills presenting to the ED with loss of vision to the right eye. Patient reported that on Friday she starts to notice blurred vision to the right eye that progressively gotten worse. Stated that she is now only able to see the bottom half of her vision of her right eye. Stated that she is a 75% of "blockage" and 25% of her vision is present. Stated that she has intermittent headaches localized to the right eye scribed is a muscular discomfort-in bed when she moves her eye upwards she has radiation of the discomfort to her vertex. Reported that her discomfort is a constant aching sensation without relief. Stated that she was seen and assessed by her ophthalmologist past afternoon, Dr. Therese Sarah, who recommended patient to come to the ED for an MRI to be performed. Patient is concerned do to this never happened to her before. Denied fever, chills, neck pain, neck stiffness, nausea, vomiting, diarrhea, weakness, numbness, tingling, chest pain, shortness of breath, difficulty breathing, smoking, cancer, pregnancy. PCP Dr. Maricela Bo  Past Medical History  Diagnosis Date  . Ovarian cyst   . Nondiabetic gastroparesis 2008    idiopathic, resolved   Past Surgical History  Procedure Laterality Date  . Tubal ligation  2005  . Tonsillectomy     No family history on file. History  Substance Use Topics  . Smoking status: Never Smoker   . Smokeless tobacco: Not on file  . Alcohol Use: No   OB History   Grav Para  Term Preterm Abortions TAB SAB Ect Mult Living                 Review of Systems  Constitutional: Negative for fever and chills.  Eyes: Positive for visual disturbance.  Respiratory: Negative for chest tightness and shortness of breath.   Cardiovascular: Negative for chest pain.  Gastrointestinal: Negative for nausea, vomiting, abdominal pain, diarrhea, constipation, blood in stool and anal bleeding.  Musculoskeletal: Negative for back pain.  Neurological: Positive for dizziness and headaches. Negative for syncope, weakness and numbness.  All other systems reviewed and are negative.     Allergies  Review of patient's allergies indicates no known allergies.  Home Medications   Prior to Admission medications   Medication Sig Start Date End Date Taking? Authorizing Provider  Aspirin-Acetaminophen-Caffeine (EXCEDRIN MIGRAINE PO) Take 1 tablet by mouth daily.   Yes Historical Provider, MD  Multiple Vitamin (MULTIVITAMIN) tablet Take 1 tablet by mouth daily.   Yes Historical Provider, MD   BP 113/54  Pulse 79  Temp(Src) 98.3 F (36.8 C) (Oral)  Resp 18  Ht 5\' 1"  (1.549 m)  Wt 98 lb (44.453 kg)  BMI 18.53 kg/m2  SpO2 100%  LMP 08/17/2013 Physical Exam  Nursing note and vitals reviewed. Constitutional: She is oriented to person, place, and time. She appears well-developed and well-nourished. No distress.  HENT:  Head: Normocephalic and atraumatic.  Mouth/Throat: Oropharynx is clear and moist. No oropharyngeal exudate.  Eyes: Conjunctivae, EOM and lids are normal. Pupils are equal, round, and reactive to  light. Right eye exhibits no chemosis, no discharge, no exudate and no hordeolum. No foreign body present in the right eye. Left eye exhibits no chemosis, no discharge, no exudate and no hordeolum. No foreign body present in the left eye. Right conjunctiva is not injected. Right conjunctiva has no hemorrhage. Left conjunctiva is not injected. Left conjunctiva has no hemorrhage.  Right eye exhibits normal extraocular motion and no nystagmus. Left eye exhibits normal extraocular motion and no nystagmus.  Fundoscopic exam:      The right eye shows no arteriolar narrowing, no AV nicking, no exudate, no hemorrhage and no papilledema.       The left eye shows no arteriolar narrowing, no AV nicking, no exudate, no hemorrhage and no papilledema.  Negative swelling, erythema, inflammation, lesions, sores identified to the right orbit. Negative erythema identified to the sclera. Negative injection noted. Negative cobblestoning. EOMs intact with discomfort identified with motion to the eye looking upwards. Discomfort upon palpation directly to the right eye. Negative pain upon palpation to the orbit negative crepitus noted upon palpation. Negative nystagmus Loss of superior and peripheral fields of vision to the right eye. Inferior field of vision intact.   Neck: Normal range of motion. Neck supple. No tracheal deviation present.  Cardiovascular: Normal rate, regular rhythm and normal heart sounds.  Exam reveals no friction rub.   No murmur heard. Pulses:      Radial pulses are 2+ on the right side, and 2+ on the left side.       Dorsalis pedis pulses are 2+ on the right side, and 2+ on the left side.  Pulmonary/Chest: Effort normal and breath sounds normal. No respiratory distress. She has no wheezes. She has no rales.  Musculoskeletal: Normal range of motion.  Full ROM to upper and lower extremities without difficulty noted, negative ataxia noted.  Lymphadenopathy:    She has no cervical adenopathy.  Neurological: She is alert and oriented to person, place, and time. No cranial nerve deficit. She exhibits normal muscle tone. Coordination normal.  Cranial nerves III-XII grossly intact Strength 5+/5+ to upper and lower extremities bilaterally with resistance applied, equal distribution noted Equal grip strength Patient is able to bring finger to nose bilaterally without  difficulty or ataxia Negative drop arm Negative arm drift Fine motor skills intact Heel to knee down shin normal bilaterally Sensation intact with differentiation sharp and dull touch Negative facial droop Negative slurred speech Negative aphasia Gait proper, proper balance - negative sway, negative drift, negative step-offs  Skin: Skin is warm and dry. No rash noted. She is not diaphoretic. No erythema.  Psychiatric: She has a normal mood and affect. Her behavior is normal. Thought content normal.    ED Course  Procedures (including critical care time)  5:16 PM This provider called patient's ophthalmology office, Dr. Hollace Kinnier answer. Office closed at 4:00PM.   9:00 PM This provider spoke with Dr. Doy Mince, neurologist-discussed case, history, presentation, labs and imaging in great detail. Neurology to consult the patient.  Results for orders placed during the hospital encounter of 09/23/13  CBC WITH DIFFERENTIAL      Result Value Ref Range   WBC 6.1  4.0 - 10.5 K/uL   RBC 4.60  3.87 - 5.11 MIL/uL   Hemoglobin 13.8  12.0 - 15.0 g/dL   HCT 43.1  36.0 - 46.0 %   MCV 93.7  78.0 - 100.0 fL   MCH 30.0  26.0 - 34.0 pg   MCHC 32.0  30.0 -  36.0 g/dL   RDW 14.0  11.5 - 15.5 %   Platelets 113 (*) 150 - 400 K/uL   Neutrophils Relative % 62  43 - 77 %   Neutro Abs 3.8  1.7 - 7.7 K/uL   Lymphocytes Relative 25  12 - 46 %   Lymphs Abs 1.6  0.7 - 4.0 K/uL   Monocytes Relative 9  3 - 12 %   Monocytes Absolute 0.6  0.1 - 1.0 K/uL   Eosinophils Relative 3  0 - 5 %   Eosinophils Absolute 0.2  0.0 - 0.7 K/uL   Basophils Relative 0  0 - 1 %   Basophils Absolute 0.0  0.0 - 0.1 K/uL  COMPREHENSIVE METABOLIC PANEL      Result Value Ref Range   Sodium 140  137 - 147 mEq/L   Potassium 4.0  3.7 - 5.3 mEq/L   Chloride 103  96 - 112 mEq/L   CO2 25  19 - 32 mEq/L   Glucose, Bld 68 (*) 70 - 99 mg/dL   BUN 10  6 - 23 mg/dL   Creatinine, Ser 0.83  0.50 - 1.10 mg/dL   Calcium 9.1  8.4 - 10.5  mg/dL   Total Protein 7.7  6.0 - 8.3 g/dL   Albumin 3.8  3.5 - 5.2 g/dL   AST 18  0 - 37 U/L   ALT 10  0 - 35 U/L   Alkaline Phosphatase 89  39 - 117 U/L   Total Bilirubin 0.5  0.3 - 1.2 mg/dL   GFR calc non Af Amer >90  >90 mL/min   GFR calc Af Amer >90  >90 mL/min  TROPONIN I      Result Value Ref Range   Troponin I <0.30  <0.30 ng/mL  APTT      Result Value Ref Range   aPTT 23 (*) 24 - 37 seconds  PROTIME-INR      Result Value Ref Range   Prothrombin Time 13.4  11.6 - 15.2 seconds   INR 1.04  0.00 - 1.49  POC URINE PREG, ED      Result Value Ref Range   Preg Test, Ur NEGATIVE  NEGATIVE    Labs Review Labs Reviewed  CBC WITH DIFFERENTIAL - Abnormal; Notable for the following:    Platelets 113 (*)    All other components within normal limits  COMPREHENSIVE METABOLIC PANEL - Abnormal; Notable for the following:    Glucose, Bld 68 (*)    All other components within normal limits  APTT - Abnormal; Notable for the following:    aPTT 23 (*)    All other components within normal limits  TROPONIN I  PROTIME-INR  POC URINE PREG, ED    Imaging Review Mr Brain Wo Contrast  09/23/2013   CLINICAL DATA:  Visual field loss in the right superior visual field. Normal ophthalmological exam.  EXAM: MRI HEAD WITHOUT CONTRAST  TECHNIQUE: Multiplanar, multiecho pulse sequences of the brain and surrounding structures were obtained without intravenous contrast.  COMPARISON:  None.  FINDINGS: Cerebellar tonsils extend 7 mm through the foramen magnum consistent with mild Chiari malformation. There is no evidence of acquired brain pathology. No sign of old or acute infarction, mass lesion, hemorrhage, hydrocephalus or extra-axial collection. No evidence of demyelinating disease. No pituitary mass. No inflammatory sinus disease.  IMPRESSION: No acute infarction. No abnormality seen to explain visual field defect. No evidence of demyelinating disease.  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 This can be associated with headache and neurological symptoms, but I am not aware of it being related to visual field defect.   Electronically Signed   By: Nelson Chimes M.D.   On: 09/23/2013 19:59     EKG Interpretation None      Date: 09/24/2013  Rate: 75  Rhythm: normal sinus rhythm  QRS Axis: normal  Intervals: normal  ST/T Wave abnormalities: normal  Conduction Disutrbances:none  Narrative Interpretation: RSR' in lead V1 or V2 - appears to be a normal variant  Old EKG Reviewed: none available EKG analyzed and reviewed by this provider and attending physician.   MDM   Final diagnoses:  None   Filed Vitals:   09/23/13 1815 09/23/13 1900 09/23/13 2031 09/23/13 2100  BP: 113/64 117/62 125/71 113/54  Pulse: 71 72 84 79  Temp:      TempSrc:      Resp:      Height:      Weight:      SpO2: 100% 100% 100% 100%    EKG noted normal sinus rhythm with a heart rate of 75 bpm. Troponin negative elevation. CBC negative elevated white blood cell count noted-negative left shift or leukocytosis. CMP negative findings-kidney and liver functioning well. PT/INR within normal limits. APTT mildly low at 23. Urine pregnancy negative. MRI brain negative for acute infarct-abnormalities seen for explanation visual defect, no evidence of demyelination. 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  Negative focal neurological deficits noted. New finding of Chiari 1 malformation on MRI identified. Neurology consulted - neurology to see patient. Discussed case with Junius Creamer, NP. Transfer of care to Junius Creamer, NP at change in shift.     Jamse Mead, PA-C 09/24/13 1124

## 2013-09-23 NOTE — ED Notes (Signed)
Md Reynolds at bedside.  

## 2013-09-23 NOTE — ED Provider Notes (Signed)
PAteint has been evaluated by Dr. Doy Mince, neurology who recommends admission for further evaluation   Garald Balding, NP 09/23/13 2327

## 2013-09-23 NOTE — ED Provider Notes (Signed)
pateint evaluated by Dr. Doy Mince- neurology who recommends admission   Garald Balding, NP 09/23/13 2328

## 2013-09-23 NOTE — ED Notes (Signed)
Pt returned from MRI, Md Vanita Panda at bedside discussing results.

## 2013-09-23 NOTE — ED Notes (Signed)
The pt has had a vision loss in her rt eye since Friday.  She had headaches for 2 days before she lost the vision in her rt eys.  She saw an eye doctor today that cleared her for a detached retina and sent her here for further treatment.  The eye doctor reports that her vision  In that rt eye is a superior field vision loss

## 2013-09-23 NOTE — ED Notes (Signed)
MD at bedside. 
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
## 2013-09-24 ENCOUNTER — Encounter (HOSPITAL_COMMUNITY): Payer: Self-pay | Admitting: Family Medicine

## 2013-09-24 ENCOUNTER — Observation Stay (HOSPITAL_COMMUNITY): Payer: 59

## 2013-09-24 DIAGNOSIS — J309 Allergic rhinitis, unspecified: Secondary | ICD-10-CM

## 2013-09-24 DIAGNOSIS — H5340 Unspecified visual field defects: Secondary | ICD-10-CM | POA: Diagnosis present

## 2013-09-24 LAB — SEDIMENTATION RATE: Sed Rate: 1 mm/hr (ref 0–22)

## 2013-09-24 LAB — CBC
HEMATOCRIT: 38.9 % (ref 36.0–46.0)
Hemoglobin: 12.6 g/dL (ref 12.0–15.0)
MCH: 29.9 pg (ref 26.0–34.0)
MCHC: 32.4 g/dL (ref 30.0–36.0)
MCV: 92.2 fL (ref 78.0–100.0)
Platelets: 98 10*3/uL — ABNORMAL LOW (ref 150–400)
RBC: 4.22 MIL/uL (ref 3.87–5.11)
RDW: 14 % (ref 11.5–15.5)
WBC: 5.2 10*3/uL (ref 4.0–10.5)

## 2013-09-24 LAB — TSH: TSH: 1.9 u[IU]/mL (ref 0.350–4.500)

## 2013-09-24 LAB — PROTIME-INR
INR: 1.13 (ref 0.00–1.49)
PROTHROMBIN TIME: 14.3 s (ref 11.6–15.2)

## 2013-09-24 LAB — CREATININE, SERUM
Creatinine, Ser: 0.83 mg/dL (ref 0.50–1.10)
GFR calc Af Amer: >90 mL/min (ref >90)
GFR calc non Af Amer: >90 mL/min (ref >90)

## 2013-09-24 LAB — HEMOGLOBIN A1C
HEMOGLOBIN A1C: 5.1 % (ref <5.7)
MEAN PLASMA GLUCOSE: 100 mg/dL (ref <117)

## 2013-09-24 LAB — C-REACTIVE PROTEIN

## 2013-09-24 MED ORDER — SODIUM CHLORIDE 0.9 % IV SOLN: 250.0000 mL | INTRAVENOUS | Status: DC | PRN

## 2013-09-24 MED ORDER — ONDANSETRON HCL 4 MG PO TABS
4.0000 mg | ORAL_TABLET | Freq: Four times a day (QID) | ORAL | Status: DC | PRN
Start: 2013-09-24 — End: 2013-09-25

## 2013-09-24 MED ORDER — ONDANSETRON HCL 4 MG/2ML IJ SOLN: 4.0000 mg | Freq: Four times a day (QID) | INTRAMUSCULAR | Status: DC | PRN

## 2013-09-24 MED ORDER — ACETAMINOPHEN 325 MG PO TABS
650.0000 mg | ORAL_TABLET | Freq: Four times a day (QID) | ORAL | Status: DC | PRN
  Administered 2013-09-24: 650 mg via ORAL
  Filled 2013-09-24: qty 2

## 2013-09-24 MED ORDER — GADOBENATE DIMEGLUMINE 529 MG/ML IV SOLN
10.0000 mL | Freq: Once | INTRAVENOUS | Status: AC | PRN
  Administered 2013-09-24: 10 mL via INTRAVENOUS

## 2013-09-24 MED ORDER — HEPARIN SODIUM (PORCINE) 5000 UNIT/ML IJ SOLN
5000.0000 [IU] | Freq: Three times a day (TID) | INTRAMUSCULAR | Status: DC
  Administered 2013-09-24 (×3): 5000 [IU] via SUBCUTANEOUS
  Filled 2013-09-24 (×8): qty 1

## 2013-09-24 MED ORDER — ACETAMINOPHEN 650 MG RE SUPP: 650.0000 mg | Freq: Four times a day (QID) | RECTAL | Status: DC | PRN

## 2013-09-24 MED ORDER — SODIUM CHLORIDE 0.9 % IJ SOLN
3.0000 mL | Freq: Two times a day (BID) | INTRAMUSCULAR | Status: DC
  Administered 2013-09-24 – 2013-09-25 (×4): 3 mL via INTRAVENOUS

## 2013-09-24 MED ORDER — MORPHINE SULFATE 2 MG/ML IJ SOLN
1.0000 mg | INTRAMUSCULAR | Status: DC | PRN
  Administered 2013-09-24 (×3): 1 mg via INTRAVENOUS
  Filled 2013-09-24 (×3): qty 1

## 2013-09-24 MED ORDER — SODIUM CHLORIDE 0.9 % IJ SOLN
3.0000 mL | INTRAMUSCULAR | Status: DC | PRN
Start: 2013-09-24 — End: 2013-09-25

## 2013-09-24 MED ORDER — BISACODYL 5 MG PO TBEC: 5.0000 mg | DELAYED_RELEASE_TABLET | Freq: Every day | ORAL | Status: DC | PRN

## 2013-09-24 NOTE — Discharge Summary (Signed)
[facility] Discharge Summary  Patient name: [name] Medical record number: [id number] Date of birth: 28-Oct-1980 Age: 33 y.o. Gender: female Date of Admission: 09/23/2013  Date of Discharge: 09/25/13 Admitting Physician: Minerva Ends, MD  Primary Care Provider: Dewain Penning, MD Consultants: neuro  Indication for Hospitalization: visual field disturbance and headache   Discharge Diagnoses/Problem List:  Patient Active Problem List   Diagnosis Date Noted  . Visual field cut 09/24/2013  . Seasonal allergies 08/02/2013   Disposition: home  Discharge Condition: stable, unchanged  Discharge Exam:  BP 108/70  Pulse 69  Temp(Src) 98.3 F (36.8 C) (Oral)  Resp 16  Ht 5\' 1"  (1.549 m)  Wt 94 lb 6.4 oz (42.82 kg)  BMI 17.85 kg/m2  SpO2 100%  LMP 08/17/2013 General: young adult female in NAD, sitting up in bed, pleasant  HEENT: /AT, EOMI, MMM, PERRL Right visual field decreased superiorly in temporal and nasal fields by confrontation (to approx 50%); Left visual fields full  Cardiovascular: RRR, no murmur appreciated  Respiratory: CTAB, no wheezes  Abdomen: soft, nontender, BS+  Extremities: warm, well-perfused, no LE edema  Skin: warm, dry, intact, no rashes or insect-bite-like lesions  Neuro: alert / oriented, CN III - XII intact, with deficit in II as above (right superior visual field deficits) Strength 5/5 in both bilateral upper and lower ext  Brief Hospital Course:  [name] is a 32y.o prev well F presenting with R upper eye visual field defect of acute onset. Reports one year of intermittent vertigo lasting 90 secs at a time, resolving with deep breathing. Also reports 30 mins of R leg numbness 2-3 months ago. Vision loss associated with headache and R eye pain with abduction. No head trauma, fever, eye drainage, red eye. Saw her optometrist, no deficits of dilated eye exam, pt then to the ED for MRI which demonstrated (likely  unrelated) Chiari I malformation. Neurology consulted by EDP and pt admitted for further w/up. CBC, BMET largely unremarkable apart from mild thrombocytosis (98). EKG nsr, trop neg, upreg neg. Lyme titer, HIV, Hgba1c, coags, CRP obtained and were all normal. MRI orbit w/wo ordered and revealed optic neuritis. Neuro initiated high dose IV steroids with 1gm solumedrol daily X3 days (patient to complete 2 further doses as outpt in short stay unit). Pt felt that vision improved somewhat already in right eye. Given precautions regarding driving/childcare safety. Pt aware and amenable to fact that she needs transportation. Will f/up with [facility] outpt for LP (cannot be obtained at this time 2/2 lovenox).   Issues for Follow Up:  1. Outpt LP for further studies for testing 2. Completion of steroid course (X3 total days)  Significant Procedures: none  Significant Labs and Imaging:   Recent Labs Lab 09/23/13 1649 09/24/13 0723  WBC 6.1 5.2  HGB 13.8 12.6  HCT 43.1 38.9  PLT 113* 98*    Recent Labs Lab 09/23/13 1649 09/24/13 0723  NA 140  --   K 4.0  --   CL 103  --   CO2 25  --   GLUCOSE 68*  --   BUN 10  --   CREATININE 0.83 0.83  CALCIUM 9.1  --   ALKPHOS 89  --   AST 18  --   ALT 10  --   ALBUMIN 3.8  --     Urine pregnancy test 4/26: NEGATIVE  Troponin 4/26 @1649 : NEGATIVE  EKG 4/26 @2204 : NSR, nonischemic   MRI Brain without contrast,  4/26 @1945 :  - No acute infarct, mass, or definite evidence of demylination  - Mild Chiari I malformation with cerebellar tonsil extension through foramen magnum of 7 mm  MRI orbit IMPRESSION:  Abnormal appearance of the right optic nerve as above, suggestive of  optic neuritis. Other considerations might include infectious  neuritis or granulomatous disease  Results/Tests Pending at Time of Discharge:   Discharge Medications:    Medication List    STOP taking these medications       EXCEDRIN MIGRAINE PO      TAKE these  medications       methylPREDNISolone sodium succinate 1,000 mg in sodium chloride 0.9 % 50 mL  Inject 1,000 mg into the vein daily. For the next 2 days     multivitamin tablet  Take 1 tablet by mouth daily.        Discharge Instructions: Please refer to Patient Instructions section of EMR for full details.  Patient was counseled important signs and symptoms that should prompt return to medical care, changes in medications, dietary instructions, activity restrictions, and follow up appointments.   Follow-Up Appointments: Follow-up Information   Follow up with Dewain Penning, MD On 10/08/2013. (@9am  )    Specialty:  Family Medicine   Contact information:   [place] [address] [phone]       Follow up with [facility] On 09/26/2013. (please go to admitting and they will send you to the Medical Short Stay for your medication ( IV solumedrol) Wednesday at [facility] and Thursday at [facility])    Contact information:   [address] [id number] [place] [address] [phone]      Follow up with [facility] On 10/10/2013. (@10 :30 with Dr. Delice Lesch [address])    Contact information:   [place] [address] [phone]      Langston Masker, MD 09/25/2013, 1:50 PM PGY-1, [place]

## 2013-09-24 NOTE — Progress Notes (Signed)
Family Medicine Teaching Service Daily Progress Note Intern Pager: [phone]  Patient name: [name] Medical record number: [id number] Date of birth: 05-28-1981 Age: 33 y.o. Gender: female  Primary Care Provider: Dewain Penning, MD Consultants: neuro Code Status: full  Pt Overview and Major Events to Date:  4/27- brain MRI neg; extensive w/up initiated   Assessment and Plan: [name] is a 33 y.o. female presenting with right-eye superior visual field loss. PMH is significant for nondiabetic gastroparesis (resolved) and hx of ovarian cysts (not on OCP's >30 days)   # Visual field loss - broad DDx, including complex migraine, MS, retinal or eye vascular pathology, thrombotic / embolic event, infectious cause; no mass found on MRI, though notable for Chiari I malformation felt to be unrelated to visual symptoms. hx of ?right leg paresthesia and prior nondiabetic gastroparesis raises concern for primary neurologic condition. Coags nml - appreciate neurology assistance / recommendations; awaiting orbit MRI, CRP, ESR (neg), Lyme titre, TSH (nml) - may consider LP for further MS work-up pending orbit MRI  - will also check Hb A1c and PT/INR, though pt is without hx of DM or prior coagulopathy  - Tylenol for headache; morphine 1 mg IV q4 PRN  - otherwise monitor for symptom changes; consider steroid for migraine but will hold given possible further lab work  - may consider ophthalmology consultation vs outpt referral, as well, pending further work-up   FEN/GI: reg diet; saline lock PPx: subq heparin  Disposition: likely d/c home later today once eye MRI complete  Subjective: feeling better this morning; head pain only triggered when asked to move eyes; still cannot see superior fields   Objective: Temp:  [98.3 F (36.8 C)-98.8 F (37.1 C)] 98.8 F (37.1 C) (04/27 0626) Pulse Rate:  [61-84] 61 (04/27 0626) Resp:  [14-18] 16 (04/27 0626) BP: (99-129)/(54-71) 99/59 mmHg  (04/27 0626) SpO2:  [99 %-100 %] 99 % (04/27 0626) Weight:  [94 lb 6.4 oz (42.82 kg)-98 lb (44.453 kg)] 94 lb 6.4 oz (42.82 kg) (04/27 0045) Physical Exam: General: young adult female in NAD, sitting up in bed, pleasant  HEENT: [place]/AT, EOMI, MMM, PERRLA  Right visual field decreased superiorly, temporal and nasal fields by confrontation; Left visual fields full  Cardiovascular: RRR, no murmur appreciated  Respiratory: CTAB, no wheezes  Abdomen: soft, nontender, BS+  Extremities: warm, well-perfused, no LE edema  Skin: warm, dry, intact, no rashes or insect-bite-like lesions  Neuro: alert / oriented, CN III - XII intact, with deficit in II as above (right superior visual field deficits) Strength 5/5 in both bilateral upper and lower ext  Laboratory:  Recent Labs Lab 09/23/13 1649  WBC 6.1  HGB 13.8  HCT 43.1  PLT 113*    Recent Labs Lab 09/23/13 1649  NA 140  K 4.0  CL 103  CO2 25  BUN 10  CREATININE 0.83  CALCIUM 9.1  PROT 7.7  BILITOT 0.5  ALKPHOS 89  ALT 10  AST 18  GLUCOSE 68*     Imaging/Diagnostic Tests: IMPRESSION:  No acute infarction. No abnormality seen to explain visual field  defect. No evidence of demyelinating disease.  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 This can be associated with  headache and neurological symptoms, but I am not aware of it being  related to visual field defect   Langston Masker, MD 09/24/2013, 7:34 AM PGY-1, [place] Intern pager: [phone], text pages  welcome

## 2013-09-24 NOTE — Progress Notes (Signed)
NEURO HOSPITALIST PROGRESS NOTE   SUBJECTIVE:                                                                                                                        Patient continues to to have superior vision loss in the right eye only. Also continues to have right eye pain.   OBJECTIVE:                                                                                                                           Vital signs in last 24 hours: Temp:  [98 F (36.7 C)-98.8 F (37.1 C)] 98 F (36.7 C) (04/27 0931) Pulse Rate:  [61-84] 71 (04/27 0931) Resp:  [14-18] 16 (04/27 0931) BP: (99-129)/(54-74) 117/74 mmHg (04/27 0931) SpO2:  [99 %-100 %] 100 % (04/27 0931) Weight:  [42.82 kg (94 lb 6.4 oz)-44.453 kg (98 lb)] 42.82 kg (94 lb 6.4 oz) (04/27 0045)  Intake/Output from previous day: 04/26 0701 - 04/27 0700 In: 3 [I.V.:3] Out: -  Intake/Output this shift:   Nutritional status: General  Past Medical History  Diagnosis Date  . Ovarian cyst   . Nondiabetic gastroparesis 2008    idiopathic, resolved     Neurologic Exam:  Mental Status: Alert, oriented, thought content appropriate.  Speech fluent without evidence of aphasia.  Able to follow 3 step commands without difficulty. Cranial Nerves: II: Visual fields grossly normal in the left eye.  The right eye has decreased vision superiorly. Pupils equal, round, reactive to light and accommodation III,IV, VI: ptosis not present, extra-ocular motions intact bilaterally V,VII: smile symmetric, facial light touch sensation normal bilaterally VIII: hearing normal bilaterally IX,X: gag reflex present XI: bilateral shoulder shrug XII: midline tongue extension without atrophy or fasciculations  Motor: Right : Upper extremity   5/5    Left:     Upper extremity   5/5  Lower extremity   5/5     Lower extremity   5/5 Tone and bulk:normal tone throughout; no atrophy noted Sensory: Pinprick and light touch  intact throughout, bilaterally Deep Tendon Reflexes:  Right: Upper Extremity   Left: Upper extremity   biceps (C-5 to C-6) 2/4   biceps (C-5 to C-6) 2/4 tricep (C7) 2/4  triceps (C7) 2/4 Brachioradialis (C6) 2/4  Brachioradialis (C6) 2/4  Lower Extremity Lower Extremity  quadriceps (L-2 to L-4) 2/4   quadriceps (L-2 to L-4) 2/4 Achilles (S1) 2/4   Achilles (S1) 2/4  Plantars: Right: downgoing   Left: downgoing Cerebellar: normal finger-to-nose,  normal heel-to-shin test    Lab Results: Basic Metabolic Panel:  Recent Labs Lab 09/23/13 1649 09/24/13 0723  NA 140  --   K 4.0  --   CL 103  --   CO2 25  --   GLUCOSE 68*  --   BUN 10  --   CREATININE 0.83 0.83  CALCIUM 9.1  --     Liver Function Tests:  Recent Labs Lab 09/23/13 1649  AST 18  ALT 10  ALKPHOS 89  BILITOT 0.5  PROT 7.7  ALBUMIN 3.8   No results found for this basename: LIPASE, AMYLASE,  in the last 168 hours No results found for this basename: AMMONIA,  in the last 168 hours  CBC:  Recent Labs Lab 09/23/13 1649 09/24/13 0723  WBC 6.1 5.2  NEUTROABS 3.8  --   HGB 13.8 12.6  HCT 43.1 38.9  MCV 93.7 92.2  PLT 113* 98*    Cardiac Enzymes:  Recent Labs Lab 09/23/13 1649  TROPONINI <0.30    Lipid Panel: No results found for this basename: CHOL, TRIG, HDL, CHOLHDL, VLDL, LDLCALC,  in the last 168 hours  CBG: No results found for this basename: GLUCAP,  in the last 168 hours  Microbiology: Results for orders placed during the hospital encounter of 04/20/13  CULTURE, GROUP A STREP     Status: None   Collection Time    04/20/13  9:06 AM      Result Value Ref Range Status   Specimen Description THROAT   Final   Special Requests NONE   Final   Culture     Final   Value: No Beta Hemolytic Streptococci Isolated     Performed at [facility]   Report Status 04/22/2013 FINAL   Final    Coagulation Studies:  Recent Labs  09/23/13 1649 09/24/13 0723  LABPROT 13.4 14.3   INR 1.04 1.13    Imaging: Mr Brain Wo Contrast  09/23/2013   CLINICAL DATA:  Visual field loss in the right superior visual field. Normal ophthalmological exam.  EXAM: MRI HEAD WITHOUT CONTRAST  TECHNIQUE: Multiplanar, multiecho pulse sequences of the brain and surrounding structures were obtained without intravenous contrast.  COMPARISON:  None.  FINDINGS: Cerebellar tonsils extend 7 mm through the foramen magnum consistent with mild Chiari malformation. There is no evidence of acquired brain pathology. No sign of old or acute infarction, mass lesion, hemorrhage, hydrocephalus or extra-axial collection. No evidence of demyelinating disease. No pituitary mass. No inflammatory sinus disease.  IMPRESSION: No acute infarction. No abnormality seen to explain visual field defect. No evidence of demyelinating disease.  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 This can be associated with headache and neurological symptoms, but I am not aware of it being related to visual field defect.   Electronically Signed   By: Nelson Chimes M.D.   On: 09/23/2013 19:59       MEDICATIONS  Scheduled: . heparin  5,000 Units Subcutaneous 3 times per day  . sodium chloride  3 mL Intravenous Q12H    ASSESSMENT/PLAN:                                                                                                             33 year old female with visual changes. Suspect an abnormality anterior to the chiasm. MRI of the brain reviewed and shows no acute changes. There is evidence of a Chiari I but this should not cause the patient's presenting symptoms. Dilated exam showed normal intraocular findings. Differential includes an orbital abnormality, MS, TA, NAION, etc.  TSH, ESR normal.    Recommend:  1) MRI orbit with and without contrast 2) Lyme  titer 3) Based on results of above, LP may be indicated as well.      Assessment and plan discussed with with attending physician and they are in agreement.    Etta Quill PA-C [facility] [phone]  09/24/2013, 10:47 AM

## 2013-09-24 NOTE — Progress Notes (Signed)
FMTS ATTENDING  NOTE Adeel Guiffre,MD I have discussed this patient with the resident. I agree with the resident's findings, assessment and care plan.   

## 2013-09-24 NOTE — Progress Notes (Signed)
INITIAL NUTRITION ASSESSMENT  DOCUMENTATION CODES Per approved criteria  -Underweight   INTERVENTION: Provide Snacks BID Encourage PO intake RD to continue to monitor  NUTRITION DIAGNOSIS: Unintentional weight loss related to medical condition as evidenced by 4% weight loss in one month.   Goal: Pt to meet >/= 90% of their estimated nutrition needs   Monitor:  PO intake, weight trend, labs  Reason for Assessment: Malnutrition Screening Tool  33 y.o. female  Admitting Dx: <principal problem not specified>  ASSESSMENT: 33 y.o. female presenting with right-eye superior visual field loss and headaches. PMH is significant for nondiabetic gastroparesis (resolved) and hx of ovarian cysts (not on OCP's >30 days).  Pt reports feeling better today with resolution of headaches. Pt states that she usually weighs 98 lbs and has lost 4 lbs in the past month for unknown reason. Pt states her appetite has been good and she has been eating normally. She does report some occasional nausea this past month. She reports eating 90% of breakfast and most of her lunch today. She states that she typically eats several small meals throughout the day. RD to order snacks BID. Encouraged pt to continue eating several small meals with snacks. Encouraged the use of nutritional supplements if weight loss continues. RD to continue to monitor during admission.   Height: Ht Readings from Last 1 Encounters:  09/24/13 5\' 1"  (1.549 m)    Weight: Wt Readings from Last 1 Encounters:  09/24/13 94 lb 6.4 oz (42.82 kg)    Ideal Body Weight: 105 lbs  % Ideal Body Weight: 90%  Wt Readings from Last 10 Encounters:  09/24/13 94 lb 6.4 oz (42.82 kg)  08/02/13 96 lb (43.545 kg)  08/02/13 95 lb 9.6 oz (43.364 kg)    Usual Body Weight: 98 lbs  % Usual Body Weight: 96%  BMI:  Body mass index is 17.85 kg/(m^2). (Underweight)  Estimated Nutritional Needs: Kcal: 1400-1600 Protein: 50-60 grams Fluid: >/= 1.6  L/day  Skin: WDL  Diet Order: General  EDUCATION NEEDS: -No education needs identified at this time   Intake/Output Summary (Last 24 hours) at 09/24/13 1615 Last data filed at 09/24/13 0126  Gross per 24 hour  Intake      3 ml  Output      0 ml  Net      3 ml    Last BM: 4/26   Labs:   Recent Labs Lab 09/23/13 1649 09/24/13 0723  NA 140  --   K 4.0  --   CL 103  --   CO2 25  --   BUN 10  --   CREATININE 0.83 0.83  CALCIUM 9.1  --   GLUCOSE 68*  --     CBG (last 3)  No results found for this basename: GLUCAP,  in the last 72 hours  Scheduled Meds: . heparin  5,000 Units Subcutaneous 3 times per day  . sodium chloride  3 mL Intravenous Q12H    Continuous Infusions:   Past Medical History  Diagnosis Date  . Ovarian cyst   . 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 Tubal ligation  2005  . Tonsillectomy      Pryor Ochoa RD, LDN Inpatient Clinical Dietitian Pager: [phone] After Hours Pager: [phone]

## 2013-09-24 NOTE — Progress Notes (Signed)
UR Completed.  [name] [id number] 09/24/2013

## 2013-09-24 NOTE — H&P (Signed)
Attending Addendum  I examined the patient and discussed the assessment and plan with Dr. Venetia Maxon. I have reviewed the note and agree.  Briefly, 33 yo F with no significant PMHx admitted or R upper eye visual field defect, acute onset. Reports one year of intermittent vertigo lasting 90 secs at a time, resolving with deep breathing. Also reports 30 mins of R leg numbness 2-3 months ago. Vision loss associated with headache and R eye pain with abduction. No head trauma, fever, eye drainage, red eye.   Headache resolved this AM with pain medication. R eye pain persist. Otherwise comfortable. Just awaiting interval updates.   BP 99/59  Pulse 61  Temp(Src) 98.8 F (37.1 C) (Oral)  Resp 16  Ht 5\' 1"  (1.549 m)  Wt 94 lb 6.4 oz (42.82 kg)  BMI 17.85 kg/m2  SpO2 99%  LMP 08/17/2013 General appearance: alert, cooperative and no distress Head: Normocephalic, without obvious abnormality, atraumatic Eyes: conjunctivae/corneas clear. PERRL, EOM's intact.  Lungs: clear to auscultation bilaterally Heart: regular rate and rhythm, S1, S2 normal, no murmur, click, rub or gallop Neurologic: Alert and oriented X 3, normal strength and tone.   A/P: 33 yo F with loss of vision R upper eye concerning for retinopathy vs vasculitis vs thyroid dysfunction vs multiple sclerosis. Normal MRI. Neuro following. May may require LP. Appreciate recommendations.  Added HIV antibody.  Dispo pending w/u.      Boykin Nearing, [place]

## 2013-09-25 DIAGNOSIS — H469 Unspecified optic neuritis: Secondary | ICD-10-CM | POA: Diagnosis present

## 2013-09-25 DIAGNOSIS — H547 Unspecified visual loss: Secondary | ICD-10-CM

## 2013-09-25 DIAGNOSIS — H5340 Unspecified visual field defects: Secondary | ICD-10-CM

## 2013-09-25 LAB — HIV ANTIBODY (ROUTINE TESTING W REFLEX): HIV 1&2 Ab, 4th Generation: NONREACTIVE

## 2013-09-25 LAB — B. BURGDORFI ANTIBODIES: B BURGDORFERI AB IGG+ IGM: 0.78 {ISR}

## 2013-09-25 MED ORDER — SODIUM CHLORIDE 0.9 % IV SOLN: 1000.0000 mg | Freq: Every day | INTRAVENOUS | Status: DC

## 2013-09-25 MED ORDER — SODIUM CHLORIDE 0.9 % IV SOLN
1000.0000 mg | Freq: Every day | INTRAVENOUS | Status: DC
  Administered 2013-09-25: 1000 mg via INTRAVENOUS
  Filled 2013-09-25: qty 8

## 2013-09-25 NOTE — Progress Notes (Signed)
Family Medicine Teaching Service Daily Progress Note Intern Pager: [phone]  Patient name: [name] Medical record number: [id number] Date of birth: [birth year] Age: 33 y.o. Gender: female  Primary Care Provider: Dewain Penning, MD Consultants: neuro Code Status: full  Pt Overview and Major Events to Date:  4/27- brain MRI neg; extensive w/up initiated   Assessment and Plan: [name] is a 33 y.o. female presenting with right-eye superior visual field loss. PMH is significant for nondiabetic gastroparesis (resolved) and hx of ovarian cysts (not on OCP's >30 days)   # Visual field loss - now with MRI orbit demonstrating optic neuritis.  broad DDx, including MS/other demyleinating d/o vs infectious vs inflammatory (SLE), no other findings on prior brain MRI, though notable for Chiari I malformation felt to be unrelated to visual symptoms. hx of ?right leg paresthesia and prior nondiabetic gastroparesis raises concern for primary neurologic condition. Other work up to date negative including CRP, ESR, TSH, HIV, hgbA1C, coags  - appreciate neurology assistance / recommendations -consideration of LP to eval for myelin basic protein or oligoclonal bands  - lyme titer pending  - Tylenol for headache; morphine 1 mg IV q4 PRN  - otherwise monitor for symptom changes; consider steroid for migraine but will hold given possible further lab work  - may consider ophthalmology consultation vs outpt referral, as well, pending further work-up  -if suspect MS may warrant tx with steroids and/or immunomodulators   FEN/GI: reg diet; saline lock PPx: subq heparin  Disposition: possible d/c home pending further neuro recs (consideration of LP, initiation of treatment while in house)  Subjective: No complaints this morning; still with visual field deficit. No worse but no better; otherwise in good spirits   Objective: Temp:  [98 F (36.7 C)-98.3 F (36.8 C)] 98.3 F (36.8 C) (04/28  0526) Pulse Rate:  [68-71] 69 (04/28 0526) Resp:  [16-18] 16 (04/28 0526) BP: (96-117)/(52-74) 108/70 mmHg (04/28 0526) SpO2:  [98 %-100 %] 100 % (04/28 0526) Physical Exam: General: young adult female in NAD, sitting up in bed, pleasant  HEENT: [place]/AT, EOMI, MMM, PERRLA  Right visual field decreased superiorly, temporal and nasal fields by confrontation; Left visual fields full  Cardiovascular: RRR, no murmur appreciated  Respiratory: CTAB, no wheezes  Abdomen: soft, nontender, BS+  Extremities: warm, well-perfused, no LE edema  Skin: warm, dry, intact, no rashes or insect-bite-like lesions  Neuro: alert / oriented, CN III - XII intact, with deficit in II as above (right superior visual field deficits) Strength 5/5 in both bilateral upper and lower ext  Laboratory:  Recent Labs Lab 09/23/13 1649 09/24/13 0723  WBC 6.1 5.2  HGB 13.8 12.6  HCT 43.1 38.9  PLT 113* 98*    Recent Labs Lab 09/23/13 1649 09/24/13 0723  NA 140  --   K 4.0  --   CL 103  --   CO2 25  --   BUN 10  --   CREATININE 0.83 0.83  CALCIUM 9.1  --   PROT 7.7  --   BILITOT 0.5  --   ALKPHOS 89  --   ALT 10  --   AST 18  --   GLUCOSE 68*  --      Imaging/Diagnostic Tests: MRI head IMPRESSION:  No acute infarction. No abnormality seen to explain visual field  defect. No evidence of demyelinating disease.  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 This can be associated  with  headache and neurological symptoms, but I am not aware of it being  related to visual field defect  MRI Orbit IMPRESSION:  Abnormal appearance of the right optic nerve as above, suggestive of  optic neuritis. Other considerations might include infectious  neuritis or granulomatous disease   Langston Masker, MD 09/25/2013, 7:05 AM PGY-1, [place] Intern pager: [phone], text pages welcome

## 2013-09-25 NOTE — Progress Notes (Signed)
NEURO HOSPITALIST PROGRESS NOTE   SUBJECTIVE:                                                                                                                        She no longer has a headache. Continues to have right superior vision deficit.   OBJECTIVE:                                                                                                                           Vital signs in last 24 hours: Temp:  [98 F (36.7 C)-98.3 F (36.8 C)] 98.3 F (36.8 C) (04/28 0526) Pulse Rate:  [68-71] 69 (04/28 0526) Resp:  [16-18] 16 (04/28 0526) BP: (96-108)/(52-70) 108/70 mmHg (04/28 0526) SpO2:  [98 %-100 %] 100 % (04/28 0526)  Intake/Output from previous day: 04/27 0701 - 04/28 0700 In: 36 [IV Piggyback:58] Out: -  Intake/Output this shift: Total I/O In: 250 [P.O.:240; I.V.:10] Out: -  Nutritional status: General  Past Medical History  Diagnosis Date  . Ovarian cyst   . Nondiabetic gastroparesis 2008    idiopathic, resolved    Neurologic Exam:   Mental Status:  Alert, oriented, thought content appropriate. Speech fluent without evidence of aphasia. Able to follow 3 step commands without difficulty.  Cranial Nerves:  II: Visual fields grossly normal in the left eye. The right eye has decreased vision superiorly. Pupils equal, round, reactive to light and accommodation  III,IV, VI: ptosis not present, extra-ocular motions intact bilaterally  V,VII: smile symmetric, facial light touch sensation normal bilaterally  VIII: hearing normal bilaterally  IX,X: gag reflex present  XI: bilateral shoulder shrug  XII: midline tongue extension without atrophy or fasciculations  Motor:  Right : Upper extremity 5/5   Left:  Upper extremity 5/5   Lower extremity 5/5    Lower extremity 5/5  Tone and bulk:normal tone throughout; no atrophy noted  Sensory: Pinprick and light touch intact throughout, bilaterally  Deep Tendon Reflexes:  2+  throughout Plantars:  Right: downgoing Left: downgoing  Cerebellar:  normal finger-to-nose, normal heel-to-shin test     Lab Results: Basic Metabolic Panel:  Recent Labs Lab 09/23/13 1649 09/24/13 0723  NA 140  --   K 4.0  --  CL 103  --   CO2 25  --   GLUCOSE 68*  --   BUN 10  --   CREATININE 0.83 0.83  CALCIUM 9.1  --     Liver Function Tests:  Recent Labs Lab 09/23/13 1649  AST 18  ALT 10  ALKPHOS 89  BILITOT 0.5  PROT 7.7  ALBUMIN 3.8   No results found for this basename: LIPASE, AMYLASE,  in the last 168 hours No results found for this basename: AMMONIA,  in the last 168 hours  CBC:  Recent Labs Lab 09/23/13 1649 09/24/13 0723  WBC 6.1 5.2  NEUTROABS 3.8  --   HGB 13.8 12.6  HCT 43.1 38.9  MCV 93.7 92.2  PLT 113* 98*    Cardiac Enzymes:  Recent Labs Lab 09/23/13 1649  TROPONINI <0.30    Lipid Panel: No results found for this basename: CHOL, TRIG, HDL, CHOLHDL, VLDL, LDLCALC,  in the last 168 hours  CBG: No results found for this basename: GLUCAP,  in the last 168 hours  Microbiology: Results for orders placed during the hospital encounter of 04/20/13  CULTURE, GROUP A STREP     Status: None   Collection Time    04/20/13  9:06 AM      Result Value Ref Range Status   Specimen Description THROAT   Final   Special Requests NONE   Final   Culture     Final   Value: No Beta Hemolytic Streptococci Isolated     Performed at [facility]   Report Status 04/22/2013 FINAL   Final    Coagulation Studies:  Recent Labs  09/23/13 1649 09/24/13 0723  LABPROT 13.4 14.3  INR 1.04 1.13    Imaging: Mr Brain Wo Contrast  09/23/2013   CLINICAL DATA:  Visual field loss in the right superior visual field. Normal ophthalmological exam.  EXAM: MRI HEAD WITHOUT CONTRAST  TECHNIQUE: Multiplanar, multiecho pulse sequences of the brain and surrounding structures were obtained without intravenous contrast.  COMPARISON:  None.  FINDINGS:  Cerebellar tonsils extend 7 mm through the foramen magnum consistent with mild Chiari malformation. There is no evidence of acquired brain pathology. No sign of old or acute infarction, mass lesion, hemorrhage, hydrocephalus or extra-axial collection. No evidence of demyelinating disease. No pituitary mass. No inflammatory sinus disease.  IMPRESSION: No acute infarction. No abnormality seen to explain visual field defect. No evidence of demyelinating disease.  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 This can be associated with headache and neurological symptoms, but I am not aware of it being related to visual field defect.   Electronically Signed   By: Nelson Chimes M.D.   On: 09/23/2013 19:59   Mr Darnelle Catalan Wo/w Cm  09/24/2013   CLINICAL DATA:  Right superior visual field loss.  EXAM: MRI ORBIT WITHOUT AND WITH CONTRAST  TECHNIQUE: Multiplanar, multiecho pulse sequences of the orbits and surrounding structures were obtained including fat saturation techniques without and with intravenous contrast administration.  CONTRAST:  29mL MULTIHANCE GADOBENATE DIMEGLUMINE 529 MG/ML IV SOLN  COMPARISON:  Routine brain MRI 09/23/2013  FINDINGS: The globes appear intact. Extraocular muscles are unremarkable. The posterior aspect of the right optic nerve (predominantly intracanalicular and prechiasmatic portions) is mildly enlarged and homogeneously, intensely enhancing with associated mildly increased T2 signal. This enhancement slightly extends to involve the right aspect of the optic chiasm. The right optic nerve sheath is mildly dilated anteriorly. The left  optic nerve is unremarkable in appearance.  IMPRESSION: Abnormal appearance of the right optic nerve as above, suggestive of optic neuritis. Other considerations might include infectious neuritis or granulomatous disease.   Electronically Signed   By: Logan Bores   On: 09/24/2013 21:44       MEDICATIONS                                                                                                                         Scheduled: . methylPREDNISolone (SOLU-MEDROL) injection  1,000 mg Intravenous Daily  . sodium chloride  3 mL Intravenous Q12H    ASSESSMENT/PLAN:                                                                                                           Right optic neuritis: Patient currently on 1 gram solumedrol IV daily.  Would continue this for three total doses.  ---she may receive this as a out patient if set up with home health. Would not do LP due to having three doses of Lovenox.  Recommend Follow up with [facility] or [facility] in 2 weeks. At that time LP may be considered.   ----[facility] [address] [place], [place] [address]--[phone] or  --[facility] neurology [address] [place], [place] [address] [phone]  No further recommendations. Neurology will S/O  I have spoken to [facility] medicine teaching service-and they understand recommendations.     Assessment and plan discussed with with attending physician and they are in agreement.    Etta Quill PA-C [facility] [phone]  09/25/2013, 10:58 AM

## 2013-09-25 NOTE — Progress Notes (Signed)
CM CONSULT Patient needs IV solumedrol total of 3 doses for 3 days - first dose given today; Patient is independent of all ADL's, works full time and does not qualify for [facility] services because she is not home bound; Patient can go to Medical Short Stay at [facility] hospital to receive her other 2 doses of IV solumedrol; Short stay called, talked to [facility] - they can give the medication Wednesday at [place] and Thursday at [place]; patient will go to the [facility]	[place] - admitting dept and she will be sent to the short stay area for medication administration; Attending MD please enter orders for IV solumedrol 1gm daily x 2 days/ Aneta Mins [phone]

## 2013-09-25 NOTE — Progress Notes (Signed)
Telephone call to Medical Short Stay, talked to [facility], they received the order for IV solumedrol to start tomorrow at 9 am; Aneta Mins [phone]

## 2013-09-25 NOTE — Discharge Instructions (Signed)
[name] were seen here for headache and right eye visual field difficulties. We obtained imaging of your brain which was reassuring. We also looked at different tests for inflammation, infection, thyroid dysfunction and everything looked normal. We then looked at imaging of your eye specifically and saw some inflammation of your optic nerve. It will be very important for you to follow up closely with neurology and ophthalmology to evaluate these functions closely. The plan will likely be for neurology to test your spinal fluid as an outpatient to further make decisions about management. Should you experience sudden complete loss of vision, worsening headaches (especially those associated with nausea or vomiting) please call 911 immediately as these could be signs of a medical emergency. For the next 2 days please go to short stay in the hospital for IV steroid treatment.  For the remainder of the week, until you have completed your steroid treatments it would be best if you were transported by someone else. Even if you feel that you can see out of both eyes properly, there is a risk that a car may approach you in a blind zone. That being said, for their safety, your children should also be transported by friends or family. Please call the neurology office or [facility] for further questions regarding safety precaution questions.

## 2013-09-25 NOTE — Discharge Summary (Signed)
FMTS ATTENDING  NOTE Chelsa Stout,MD I  have seen and examined this patient, reviewed their chart. I have discussed this patient with the resident. I agree with the resident's findings, assessment and care plan. 

## 2013-09-25 NOTE — Progress Notes (Signed)
FMTS ATTENDING  NOTE Mary Leeder,MD I  have seen and examined this patient, reviewed their chart. I have discussed this patient with the resident. I agree with the resident's findings, assessment and care plan.  Patient continues to have superior vision loss, but denies worsening of her vision,loss, she denies eye or periorbital pain, no eye redness. MRI orbit report discussed with patient as well as management plan. She is currently on high dose IV steroid needing a total of 3 doses as per neuro recommendation. We will try to set her up to get her remaining doses as outpatient if possible with follow up with neurologist for LP. Patient agrees with plan.

## 2013-09-26 ENCOUNTER — Encounter (HOSPITAL_COMMUNITY)
Admit: 2013-09-26 | Discharge: 2013-09-26 | Disposition: A | Discharge status: Home / Self Care | Payer: 59 | Source: Ambulatory Visit | Attending: Family Medicine | Admitting: Family Medicine

## 2013-09-26 DIAGNOSIS — H469 Unspecified optic neuritis: Secondary | ICD-10-CM | POA: Insufficient documentation

## 2013-09-26 MED ORDER — SODIUM CHLORIDE 0.9 % IV SOLN
1000.0000 mg | INTRAVENOUS | Status: DC
  Filled 2013-09-26: qty 8

## 2013-09-26 NOTE — ED Provider Notes (Signed)
Please see my initial note  Carmin Muskrat, MD 09/26/13 [phone]

## 2013-09-26 NOTE — ED Provider Notes (Signed)
Please see my initial note  Carmin Muskrat, MD 09/26/13 [phone]

## 2013-09-26 NOTE — ED Provider Notes (Signed)
  This was a shared visit with a mid-level provided (NP or PA).  Throughout the patient's course I was available for consultation/collaboration.  I saw the ECG (if appropriate), relevant labs and studies - I agree with the interpretation.  I demonstrated the MRI images to the patient.  On my exam the patient was in no distress.  However, with new visual loss, she required admission for further E/M.       Carmin Muskrat, MD 09/26/13 [phone]

## 2013-09-27 ENCOUNTER — Encounter (HOSPITAL_COMMUNITY)
Admission: RE | Admit: 2013-09-27 | Discharge: 2013-09-27 | Disposition: A | Discharge status: Home / Self Care | Payer: 59 | Source: Ambulatory Visit | Attending: Family Medicine | Admitting: Family Medicine

## 2013-09-27 DIAGNOSIS — H469 Unspecified optic neuritis: Secondary | ICD-10-CM | POA: Diagnosis present

## 2013-09-27 MED ORDER — SODIUM CHLORIDE 0.9 % IV SOLN
1000.0000 mg | INTRAVENOUS | Status: DC
  Administered 2013-09-27: 1000 mg via INTRAVENOUS
  Filled 2013-09-27: qty 8

## 2013-09-27 MED ORDER — SODIUM CHLORIDE 0.9 % IV SOLN: 1000.0000 mg | INTRAVENOUS | Status: DC

## 2013-09-28 ENCOUNTER — Encounter (HOSPITAL_COMMUNITY): Payer: 59

## 2013-10-08 ENCOUNTER — Ambulatory Visit (INDEPENDENT_AMBULATORY_CARE_PROVIDER_SITE_OTHER): Payer: 59 | Admitting: Family Medicine

## 2013-10-08 ENCOUNTER — Encounter: Payer: Self-pay | Admitting: Family Medicine

## 2013-10-08 VITALS — BP 112/71 | HR 80 | Temp 98.5°F | Ht 61.0 in | Wt 93.0 lb

## 2013-10-08 DIAGNOSIS — G935 Compression of brain: Secondary | ICD-10-CM | POA: Insufficient documentation

## 2013-10-08 DIAGNOSIS — H469 Unspecified optic neuritis: Secondary | ICD-10-CM

## 2013-10-08 NOTE — Progress Notes (Signed)
   Subjective:    Patient ID: [name], female    DOB: 04-16-1981, 33 y.o.   MRN: [id number]  HPI  33 year old F previously healthy female who was admitted to the hospital for optic neuritis from 4/26-4/28. She underwent an extensive workup which included an MRI. His MRI of her brain demonstrated a Chiari 1 malformation, which was not thought to be associated with her visual deficits. This patient had tests for Lyme disease, HIV and coagulopathy which were normal. An MRI of her orbits demonstrated inflammation of the right optic nerve. For this the patient was placed on high-dose IV steroids per neurology recommendations. Additionally the patient is supposed to followup as an outpatient for a lumbar puncture.  Today she reports 50% improvement in the superior right visual field, but still "foggy." She denies pain or difficulty moving eyes. No headache. She feels like she can drive safely and is working without any problems.   Review of Systems Positive for fatigue Negative for weakness of extremities, headache, nausea, vomiting, fever    Objective:   Physical Exam BP 112/71  Pulse 80  Temp(Src) 98.5 F (36.9 C) (Oral)  Ht 5\' 1"  (1.549 m)  Wt 93 lb (42.185 kg)  BMI 17.58 kg/m2  LMP 09/26/2013  Gen: thin female, non ill appearing, pleasant and conversant HEENT: NCAT, PERRLA, no tenderness of temporal areas CN: CN III, IV, and VI grossly intact      Assessment & Plan:

## 2013-10-08 NOTE — Patient Instructions (Addendum)
Dear Ms.Halberstam Dopp,   Thank you for coming to clinic today. Please read below regarding the issues that we discussed.   Optic Neuritis - I am glad that you are improving. This will likely continue to improve, and we should not use oral steroids. We need to ask neurology the following questions:   1. Is there benefit to more IV steroids? 2. Should we still get a lumbar puncture?  3. Is there any routine observation that needs to be done for the chiari malformation?  Please follow up in clinic in 1 month. Please call earlier if you have any questions or concerns.   Sincerely,   Dr. Maricela Bo

## 2013-10-08 NOTE — Assessment & Plan Note (Signed)
A: symptomatically improving, visual acuity 20/50 right, 20/40 left and 20/30 combined; bigger concern is whether this is the onset of demyelinating disease like MS, but without other plaques seen on MRI, it is too early to know P: pt to follow up with neurology regarding further evaluation and possible LP; I appreciate all recommendations regarding this case and will follow up with the patient in 1 month

## 2013-10-08 NOTE — Assessment & Plan Note (Signed)
A: noticed on MRI in April 2015 and may be associate with headaches, but could also just be an incidental finding P: pt not symptomatic with headaches or neurologic symptoms other than optic neuritis, so no reason for repeat imaging at this time; if patient becomes symptomatic in the future, then I will refer to neurosurgery;pt will also ask advice of neurologist at upcoming appointment

## 2013-10-10 ENCOUNTER — Other Ambulatory Visit: Payer: Self-pay | Admitting: Neurology

## 2013-10-10 ENCOUNTER — Ambulatory Visit (INDEPENDENT_AMBULATORY_CARE_PROVIDER_SITE_OTHER): Payer: 59 | Admitting: Neurology

## 2013-10-10 ENCOUNTER — Encounter: Payer: Self-pay | Admitting: Neurology

## 2013-10-10 VITALS — BP 105/60 | HR 73 | Temp 98.1°F | Ht 61.0 in | Wt 95.3 lb

## 2013-10-10 DIAGNOSIS — G935 Compression of brain: Secondary | ICD-10-CM

## 2013-10-10 DIAGNOSIS — H469 Unspecified optic neuritis: Secondary | ICD-10-CM

## 2013-10-10 NOTE — Patient Instructions (Addendum)
1. Bloodwork for vitamin B12, vitamin D, ANA, ACE level, RPR, NMO Ab 2. MRI cervical and thoracic spine with and without contrast 3. Refer to ophthalmology
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
## 2013-10-11 ENCOUNTER — Encounter: Payer: Self-pay | Admitting: Neurology

## 2013-10-11 LAB — VITAMIN D 25 HYDROXY (VIT D DEFICIENCY, FRACTURES): Vit D, 25-Hydroxy: 14 ng/mL — ABNORMAL LOW (ref 30–89)

## 2013-10-11 LAB — ANA: Anti Nuclear Antibody(ANA): POSITIVE — AB

## 2013-10-11 LAB — RPR

## 2013-10-11 LAB — VITAMIN B12: Vitamin B-12: 443 pg/mL (ref 211–911)

## 2013-10-11 LAB — ANGIOTENSIN CONVERTING ENZYME: Angiotensin-Converting Enzyme: 44 U/L (ref 8–52)

## 2013-10-11 LAB — ANTI-NUCLEAR AB-TITER (ANA TITER): ANA TITER 1: NEGATIVE (ref <1:40)

## 2013-10-12 ENCOUNTER — Encounter: Payer: Self-pay | Admitting: *Deleted

## 2013-10-12 NOTE — Progress Notes (Signed)
[name] called Friday afternoon at 4:15 to let you know that authorization for MRI is still pending.  You may need to check Monday morning to make sure it is covered.

## 2013-10-15 ENCOUNTER — Other Ambulatory Visit: Payer: 59

## 2013-10-15 ENCOUNTER — Ambulatory Visit
Admission: RE | Admit: 2013-10-15 | Discharge: 2013-10-15 | Disposition: A | Discharge status: Home / Self Care | Payer: 59 | Source: Ambulatory Visit | Attending: Neurology | Admitting: Neurology

## 2013-10-15 DIAGNOSIS — H469 Unspecified optic neuritis: Secondary | ICD-10-CM

## 2013-10-15 MED ORDER — GADOBENATE DIMEGLUMINE 529 MG/ML IV SOLN
8.0000 mL | Freq: Once | INTRAVENOUS | Status: AC | PRN
  Administered 2013-10-15: 8 mL via INTRAVENOUS

## 2013-10-16 ENCOUNTER — Telehealth: Payer: Self-pay | Admitting: Neurology

## 2013-10-16 NOTE — Telephone Encounter (Signed)
Spoke with patient, MRI cervical and thoracic spine are normal, no evidence of demyelination. She is scheduled for MRI lumbar on Friday. No new symptoms.

## 2013-10-18 LAB — NEUROMYELITIS OPTICA AUTOAB, IGG: NMO-IGG: POSITIVE

## 2013-10-19 ENCOUNTER — Ambulatory Visit
Admission: RE | Admit: 2013-10-19 | Discharge: 2013-10-19 | Disposition: A | Discharge status: Home / Self Care | Payer: 59 | Source: Ambulatory Visit | Attending: Neurology | Admitting: Neurology

## 2013-10-19 ENCOUNTER — Telehealth: Payer: Self-pay | Admitting: Neurology

## 2013-10-19 DIAGNOSIS — H469 Unspecified optic neuritis: Secondary | ICD-10-CM

## 2013-10-19 MED ORDER — GADOBENATE DIMEGLUMINE 529 MG/ML IV SOLN
8.0000 mL | Freq: Once | INTRAVENOUS | Status: AC | PRN
  Administered 2013-10-19: 8 mL via INTRAVENOUS

## 2013-10-19 NOTE — Telephone Encounter (Signed)
Pt called/returning your call at 3:18PM.

## 2013-10-19 NOTE — Telephone Encounter (Signed)
Spoke with patient.

## 2013-10-25 ENCOUNTER — Ambulatory Visit (INDEPENDENT_AMBULATORY_CARE_PROVIDER_SITE_OTHER): Payer: 59 | Admitting: Neurology

## 2013-10-25 ENCOUNTER — Encounter: Payer: Self-pay | Admitting: Neurology

## 2013-10-25 VITALS — BP 105/60 | HR 98 | Temp 98.2°F | Ht 61.0 in | Wt 95.3 lb

## 2013-10-25 DIAGNOSIS — H469 Unspecified optic neuritis: Secondary | ICD-10-CM
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
## 2013-10-25 NOTE — Patient Instructions (Addendum)
1. Refer to Dr. Sherre Lain at [facility] for optic neuritis and positive NMO antibody 2. Schedule ophtho visit for follow-up on visual acuity 3. Call our office for any new symptoms

## 2013-10-26 ENCOUNTER — Encounter: Payer: Self-pay | Admitting: Neurology

## 2013-10-29 ENCOUNTER — Other Ambulatory Visit (HOSPITAL_COMMUNITY): Payer: Self-pay | Admitting: *Deleted

## 2013-10-29 ENCOUNTER — Encounter: Payer: Self-pay | Admitting: Neurology

## 2013-10-29 ENCOUNTER — Telehealth: Payer: Self-pay | Admitting: Neurology

## 2013-10-29 ENCOUNTER — Ambulatory Visit (INDEPENDENT_AMBULATORY_CARE_PROVIDER_SITE_OTHER): Payer: BC Managed Care – PPO | Admitting: Neurology

## 2013-10-29 ENCOUNTER — Other Ambulatory Visit: Payer: Self-pay | Admitting: *Deleted

## 2013-10-29 VITALS — BP 105/60 | HR 78 | Ht 61.0 in | Wt 95.0 lb

## 2013-10-29 DIAGNOSIS — H469 Unspecified optic neuritis: Secondary | ICD-10-CM

## 2013-10-29 MED ORDER — METHYLPREDNISOLONE SODIUM SUCC 1000 MG IJ SOLR: 1000.0000 mg | Freq: Every day | INTRAMUSCULAR | Status: DC

## 2013-10-29 MED ORDER — OMEPRAZOLE 40 MG PO CPDR: 40.0000 mg | DELAYED_RELEASE_CAPSULE | Freq: Every day | ORAL | Status: DC

## 2013-10-29 NOTE — Patient Instructions (Signed)
1. MRI brain with and without contrast 2. MRI orbit with and without contrast 3. Ophtho eval  4. Start IV Solumedrol x 5 days 5. Use Prilosec daily while on steroids 6. Call our office for any problems
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
## 2013-10-29 NOTE — Telephone Encounter (Signed)
Patient scheduled and notified.

## 2013-10-29 NOTE — Telephone Encounter (Signed)
Spoke to patient, she emailed after hours Friday 9pm that she was starting to have significant eye pain with eye movements on the right eye again and that vision is worsening on the right eye again.  Denies any infections. She did not call on call physician, and reports that over the weekend decreased vision has been stable, eye pain continues with some relief from Advil.  Will need to repeat MRI brain and orbits with and without contrast, and set up for IV steroids x 3 days.  Patient will be seen in office today for neuro exam.  Erica, pls schedule pt today at 1130am for urgent follow-up, can you order IV methylprednisolone 1000mg  daily for 3 days at infusion center, first dose today please.  Thanks

## 2013-10-30 ENCOUNTER — Encounter (HOSPITAL_COMMUNITY)
Admission: RE | Admit: 2013-10-30 | Discharge: 2013-10-30 | Disposition: A | Discharge status: Home / Self Care | Payer: BC Managed Care – PPO | Source: Ambulatory Visit | Attending: Neurology | Admitting: Neurology

## 2013-10-30 DIAGNOSIS — Z5181 Encounter for therapeutic drug level monitoring: Secondary | ICD-10-CM | POA: Insufficient documentation

## 2013-10-30 DIAGNOSIS — H469 Unspecified optic neuritis: Secondary | ICD-10-CM | POA: Insufficient documentation

## 2013-10-30 MED ORDER — METHYLPREDNISOLONE SODIUM SUCC 1000 MG IJ SOLR
1000.0000 mg | INTRAMUSCULAR | Status: DC
  Administered 2013-10-30: 1000 mg via INTRAVENOUS
  Filled 2013-10-30: qty 8

## 2013-10-30 MED ORDER — SODIUM CHLORIDE 0.9 % IV SOLN: 1000.0000 mg | INTRAVENOUS | Status: DC

## 2013-10-30 NOTE — Discharge Instructions (Signed)
Methylprednisolone Solution for Injection What is this medicine? METHYLPREDNISOLONE (meth ill pred NISS oh lone) is a corticosteroid. It is commonly used to treat inflammation of the skin, joints, lungs, and other organs. Common conditions treated include asthma, allergies, and arthritis. It is also used for other conditions, such as blood disorders and diseases of the adrenal glands. This medicine may be used for other purposes; ask your health care provider or pharmacist if you have questions. COMMON BRAND NAME(S): A-Methapred, Solu-Medrol What should I tell my health care provider before I take this medicine? They need to know if you have any of these conditions: -cataracts or glaucoma -Cushing's syndrome -heart disease -high blood pressure -infection including tuberculosis -low calcium or potassium levels in the blood -recent surgery -seizures -stomach or intestinal disease, including colitis -threadworms -thyroid problems -an unusual or allergic reaction to methylprednisolone, corticosteroids, benzyl alcohol, other medicines, foods, dyes, or preservatives -pregnant or trying to get pregnant -breast-feeding How should I use this medicine? This medicine is for injection or infusion into a vein. It is also for injection into a muscle. It is given by a health care professional in a hospital or clinic setting. Talk to your pediatrician regarding the use of this medicine in children. While this drug may be prescribed for selected conditions, precautions do apply. Overdosage: If you think you have taken too much of this medicine contact a poison control center or emergency room at once. NOTE: This medicine is only for you. Do not share this medicine with others. What if I miss a dose? This does not apply. What may interact with this medicine? Do not take this medicine with any of the following medications: -mifepristone This medicine may also interact with the following  medications: -aspirin and aspirin-like medicines -cyclosporin -ketoconazole -phenobarbital -phenytoin -rifampin -tacrolimus -troleandomycin -vaccines -warfarin This list may not describe all possible interactions. Give your health care provider a list of all the medicines, herbs, non-prescription drugs, or dietary supplements you use. Also tell them if you smoke, drink alcohol, or use illegal drugs. Some items may interact with your medicine. What should I watch for while using this medicine? Visit your doctor or health care professional for regular checks on your progress. If you are taking this medicine for a long time, carry an identification card with your name and address, the type and dose of your medicine, and your doctor's name and address. The medicine may increase your risk of getting an infection. Stay away from people who are sick. Tell your doctor or health care professional if you are around anyone with measles or chickenpox. You may need to avoid some vaccines. Talk to your health care provider for more information. If you are going to have surgery, tell your doctor or health care professional that you have taken this medicine within the last twelve months. Ask your doctor or health care professional about your diet. You may need to lower the amount of salt you eat. The medicine can increase your blood sugar. If you are a diabetic check with your doctor if you need help adjusting the dose of your diabetic medicine. What side effects may I notice from receiving this medicine? Side effects that you should report to your doctor or health care professional as soon as possible: -allergic reactions like skin rash, itching or hives, swelling of the face, lips, or tongue -bloody or tarry stools -changes in vision -eye pain or bulging eyes -fever, sore throat, sneezing, cough, or other signs of infection, wounds that will   not heal -increased thirst -irregular heartbeat -muscle  cramps -pain in hips, back, ribs, arms, shoulders, or legs -swelling of the ankles, feet, hands -trouble passing urine or change in the amount of urine -unusual bleeding or bruising -unusually weak or tired -weight gain or weight loss Side effects that usually do not require medical attention (report to your doctor or health care professional if they continue or are bothersome): -changes in emotions or moods -constipation or diarrhea -headache -irritation at site where injected -nausea, vomiting -skin problems, acne, thin and shiny skin -trouble sleeping -unusual hair growth on the face or body This list may not describe all possible side effects. Call your doctor for medical advice about side effects. You may report side effects to FDA at 1-800-FDA-1088. Where should I keep my medicine? This drug is given in a hospital or clinic and will not be stored at home. NOTE: This sheet is a summary. It may not cover all possible information. If you have questions about this medicine, talk to your doctor, pharmacist, or health care provider.  2014, [employer]/Gold Standard. (2012-02-15 11:37:16)  

## 2013-10-31 ENCOUNTER — Encounter: Payer: Self-pay | Admitting: Neurology

## 2013-10-31 ENCOUNTER — Encounter (HOSPITAL_COMMUNITY)
Admission: RE | Admit: 2013-10-31 | Discharge: 2013-10-31 | Disposition: A | Discharge status: Home / Self Care | Payer: BC Managed Care – PPO | Source: Ambulatory Visit | Attending: Neurology | Admitting: Neurology

## 2013-10-31 MED ORDER — SODIUM CHLORIDE 0.9 % IV SOLN
1000.0000 mg | INTRAVENOUS | Status: DC
  Administered 2013-10-31: 1000 mg via INTRAVENOUS
  Filled 2013-10-31: qty 8

## 2013-11-01 ENCOUNTER — Encounter (HOSPITAL_COMMUNITY)
Admission: RE | Admit: 2013-11-01 | Discharge: 2013-11-01 | Disposition: A | Discharge status: Home / Self Care | Payer: BC Managed Care – PPO | Source: Ambulatory Visit | Attending: Neurology | Admitting: Neurology

## 2013-11-01 DIAGNOSIS — H5440 Blindness, one eye, unspecified eye: Secondary | ICD-10-CM | POA: Insufficient documentation

## 2013-11-01 DIAGNOSIS — M6281 Muscle weakness (generalized): Secondary | ICD-10-CM | POA: Diagnosis not present

## 2013-11-01 DIAGNOSIS — H5710 Ocular pain, unspecified eye: Secondary | ICD-10-CM | POA: Diagnosis not present

## 2013-11-01 DIAGNOSIS — Z8669 Personal history of other diseases of the nervous system and sense organs: Secondary | ICD-10-CM | POA: Diagnosis not present

## 2013-11-01 MED ORDER — SODIUM CHLORIDE 0.9 % IV SOLN
1000.0000 mg | INTRAVENOUS | Status: DC
  Administered 2013-11-01: 1000 mg via INTRAVENOUS
  Filled 2013-11-01: qty 8

## 2013-11-02 ENCOUNTER — Encounter (HOSPITAL_COMMUNITY)
Admission: RE | Admit: 2013-11-02 | Discharge: 2013-11-02 | Disposition: A | Discharge status: Home / Self Care | Payer: BC Managed Care – PPO | Source: Ambulatory Visit | Attending: Neurology | Admitting: Neurology

## 2013-11-02 ENCOUNTER — Other Ambulatory Visit (HOSPITAL_COMMUNITY): Payer: Self-pay | Admitting: Neurology

## 2013-11-02 MED ORDER — METHYLPREDNISOLONE SODIUM SUCC 1000 MG IJ SOLR
1000.0000 mg | INTRAMUSCULAR | Status: DC
  Administered 2013-11-02: 1000 mg via INTRAVENOUS
  Filled 2013-11-02: qty 8

## 2013-11-03 ENCOUNTER — Encounter (HOSPITAL_COMMUNITY)
Admission: RE | Admit: 2013-11-03 | Discharge: 2013-11-03 | Disposition: A | Discharge status: Home / Self Care | Payer: BC Managed Care – PPO | Source: Ambulatory Visit | Attending: Neurology | Admitting: Neurology

## 2013-11-03 MED ORDER — SODIUM CHLORIDE 0.9 % IV SOLN
1000.0000 mg | INTRAVENOUS | Status: DC
  Administered 2013-11-03: 1000 mg via INTRAVENOUS
  Filled 2013-11-03 (×2): qty 8

## 2013-11-03 MED ORDER — SODIUM CHLORIDE 0.9 % IV SOLN
INTRAVENOUS | Status: DC
  Administered 2013-11-03: 11:00:00 via INTRAVENOUS

## 2013-11-08 ENCOUNTER — Ambulatory Visit (HOSPITAL_COMMUNITY): Payer: BC Managed Care – PPO

## 2013-11-08 ENCOUNTER — Ambulatory Visit (HOSPITAL_COMMUNITY)
Admission: RE | Admit: 2013-11-08 | Discharge: 2013-11-08 | Disposition: A | Discharge status: Home / Self Care | Payer: BC Managed Care – PPO | Source: Ambulatory Visit | Attending: Neurology | Admitting: Neurology

## 2013-11-08 DIAGNOSIS — H469 Unspecified optic neuritis: Secondary | ICD-10-CM | POA: Insufficient documentation

## 2013-11-08 DIAGNOSIS — H539 Unspecified visual disturbance: Secondary | ICD-10-CM | POA: Insufficient documentation

## 2013-11-08 MED ORDER — GADOBENATE DIMEGLUMINE 529 MG/ML IV SOLN
9.0000 mL | Freq: Once | INTRAVENOUS | Status: AC | PRN
  Administered 2013-11-08: 9 mL via INTRAVENOUS

## 2013-11-09 ENCOUNTER — Ambulatory Visit: Payer: 59 | Admitting: Family Medicine

## 2013-11-19 ENCOUNTER — Encounter: Payer: Self-pay | Admitting: Family Medicine

## 2013-11-19 ENCOUNTER — Ambulatory Visit (INDEPENDENT_AMBULATORY_CARE_PROVIDER_SITE_OTHER): Payer: BC Managed Care – PPO | Admitting: Family Medicine

## 2013-11-19 VITALS — BP 101/72 | HR 85 | Temp 98.5°F | Ht 61.0 in | Wt 94.2 lb

## 2013-11-19 DIAGNOSIS — H469 Unspecified optic neuritis: Secondary | ICD-10-CM

## 2013-11-19 DIAGNOSIS — E559 Vitamin D deficiency, unspecified: Secondary | ICD-10-CM | POA: Insufficient documentation

## 2013-11-19 MED ORDER — CHOLECALCIFEROL 1.25 MG (50000 UT) PO CAPS
50000.0000 [IU] | ORAL_CAPSULE | ORAL | Status: DC
Start: 2013-11-19 — End: 2014-04-29

## 2013-11-19 NOTE — Assessment & Plan Note (Signed)
A: low at 14 ng/dl P: pt counseled on risks and benefits to vitamin D supplementation; pt prefers to start supplementation - begin D3 50,000 IU each week x 8 weeks and recheck - encouraged 30 min sunlight daily

## 2013-11-19 NOTE — Progress Notes (Signed)
   Subjective:    Patient ID: [name], female    DOB: 08-08-1980, 33 y.o.   MRN: [id number]  HPI  33 year old F with recurrent optic neuritis and low vitamin D.   Recurrent Optic Neuritis - Pt with positive ANA and NMO antibobies. She is has been treated with IV steroids in April and June 2015. She is being followed by Ellouise Newer, MD, neurologist, who recommended immunosuppressive treatment at the last visit on 10/29/13; No immunosuppressive therapy was started, but the pt does have follow up at the multiple sclerosis clinic at [facility] on July 7th; at this point she denies visual field deficits, eye pain, headaches, weakness or numbness of her extremities  Vitamin D -  25 OH Vit D low at 14 ng/mL; pt is wondering whether this could be contributing to fatigue; pt with regular menstruation, no hx of insufficiency fractures, and little sunlight exposure during the day   Review of Systems See HPI     Objective:   Physical Exam BP 101/72  Pulse 85  Temp(Src) 98.5 F (36.9 C) (Oral)  Ht 5\' 1"  (1.549 m)  Wt 94 lb 3.2 oz (42.729 kg)  BMI 17.81 kg/m2  LMP 11/18/2013  Gen: thin body habitus, well appearing, pleasant  Eyes: PERRLA, EOMI Head: NCAT, no temporal tenderness Neuro: DTR of upper and lower extremities 2+; normal grip strength, sensation intact to light touch       Assessment & Plan:

## 2013-11-19 NOTE — Assessment & Plan Note (Signed)
A: stable P: f/u at [facility] clinic at [facility] in July

## 2013-11-19 NOTE — Patient Instructions (Signed)
Dear Ms. Blaylock Dopp,   Thank you for coming to clinic today. Please read below regarding the issues that we discussed.   1. Low Vitamin D - Let's take the tablets once a week for the next 8 weeks and then follow up for another check. This may help with the fatigue.   2. Optic Neuritis - I am glad this is bette!   Please follow up in clinic in 2 months. Please call earlier if you have any questions or concerns.   Sincerely,   Dr. Maricela Bo

## 2014-01-24 ENCOUNTER — Encounter: Payer: Self-pay | Admitting: Neurology

## 2014-01-24 ENCOUNTER — Ambulatory Visit (INDEPENDENT_AMBULATORY_CARE_PROVIDER_SITE_OTHER): Payer: BC Managed Care – PPO | Admitting: Neurology

## 2014-01-24 VITALS — BP 90/68 | HR 77 | Temp 98.4°F | Ht 61.0 in | Wt 93.0 lb

## 2014-01-24 DIAGNOSIS — G360 Neuromyelitis optica [Devic]: Secondary | ICD-10-CM
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
## 2014-01-24 NOTE — Patient Instructions (Signed)
1. Continue to monitor fatigue and mood with change in work schedule 2. Continue follow-up at [facility] and update [place] regarding plan of care 3. Follow-up in 3 months

## 2014-01-29 ENCOUNTER — Telehealth: Payer: Self-pay | Admitting: Neurology

## 2014-01-29 NOTE — Telephone Encounter (Signed)
Returned call to patient notified patient that form was faxed yesterday.

## 2014-01-29 NOTE — Telephone Encounter (Signed)
Pt called wanting to confirm if the form explaining limitation for work was faxed over to HR. Please call pt to confirm # [phone]

## 2014-02-14 ENCOUNTER — Encounter: Payer: Self-pay | Admitting: Neurology

## 2014-02-15 ENCOUNTER — Other Ambulatory Visit: Payer: Self-pay | Admitting: Family Medicine

## 2014-02-15 ENCOUNTER — Telehealth: Payer: Self-pay | Admitting: Family Medicine

## 2014-02-15 DIAGNOSIS — G360 Neuromyelitis optica [Devic]: Secondary | ICD-10-CM

## 2014-02-15 NOTE — Telephone Encounter (Signed)
Called patient and notified her of order placed for Hepatitis testing. Order left up front for pick-up. Patient will come by the office on Monday to pick up order and go downstairs to [facility] for blood draw.

## 2014-02-15 NOTE — Telephone Encounter (Signed)
Message copied by Thurmon Fair on Fri Feb 15, 2014  4:07 PM ------      Message from: Cameron Sprang      Created: Fri Feb 15, 2014 11:09 AM      Regarding: lab order       Can you pls order Hepatitis panel for her (pls make sure it checks for Hepatitis B and C). Thanks ------

## 2014-02-19 LAB — HEPATITIS PANEL, ACUTE
HCV Ab: NEGATIVE
Hep A IgM: NONREACTIVE
Hep B C IgM: NONREACTIVE
Hepatitis B Surface Ag: NEGATIVE

## 2014-03-15 ENCOUNTER — Other Ambulatory Visit: Payer: Self-pay

## 2014-04-18 ENCOUNTER — Encounter: Payer: Self-pay | Admitting: Neurology

## 2014-04-29 ENCOUNTER — Ambulatory Visit (INDEPENDENT_AMBULATORY_CARE_PROVIDER_SITE_OTHER): Payer: BC Managed Care – PPO | Admitting: Neurology

## 2014-04-29 ENCOUNTER — Encounter: Payer: Self-pay | Admitting: Neurology

## 2014-04-29 VITALS — BP 98/60 | HR 83 | Resp 16 | Ht 61.0 in | Wt 93.0 lb

## 2014-04-29 DIAGNOSIS — F329 Major depressive disorder, single episode, unspecified: Secondary | ICD-10-CM

## 2014-04-29 DIAGNOSIS — G360 Neuromyelitis optica [Devic]: Secondary | ICD-10-CM

## 2014-04-29 DIAGNOSIS — F32A Depression, unspecified: Secondary | ICD-10-CM

## 2014-04-29 MED ORDER — ESCITALOPRAM OXALATE 10 MG PO TABS: ORAL_TABLET | ORAL | Status: DC

## 2014-04-29 NOTE — Patient Instructions (Signed)
1. Start Lexapro 10mg : Take 1/2 tablet daily 2. Continue Rituximab treatments and follow-up with Dr. George Hugh as scheduled 3. Follow-up in 3 months
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
## 2014-07-29 ENCOUNTER — Ambulatory Visit (INDEPENDENT_AMBULATORY_CARE_PROVIDER_SITE_OTHER): Payer: BLUE CROSS/BLUE SHIELD | Admitting: Neurology

## 2014-07-29 ENCOUNTER — Encounter: Payer: Self-pay | Admitting: Neurology

## 2014-07-29 VITALS — BP 130/74 | HR 83 | Ht 61.0 in | Wt 103.1 lb

## 2014-07-29 DIAGNOSIS — F32A Depression, unspecified: Secondary | ICD-10-CM

## 2014-07-29 DIAGNOSIS — F329 Major depressive disorder, single episode, unspecified: Secondary | ICD-10-CM

## 2014-07-29 DIAGNOSIS — G360 Neuromyelitis optica [Devic]: Secondary | ICD-10-CM

## 2014-07-29 MED ORDER — ESCITALOPRAM OXALATE 10 MG PO TABS: ORAL_TABLET | ORAL | Status: DC
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
## 2014-07-29 NOTE — Patient Instructions (Signed)
1. Increase Lexapro 10mg : take 1 tablet daily 2. Continue with Rituximab and Prednisone taper as planned 3. Call our office for any problems, follow-up in 4 months

## 2014-11-29 ENCOUNTER — Encounter: Payer: Self-pay | Admitting: Neurology

## 2014-11-29 ENCOUNTER — Ambulatory Visit (INDEPENDENT_AMBULATORY_CARE_PROVIDER_SITE_OTHER): Payer: BLUE CROSS/BLUE SHIELD | Admitting: Neurology

## 2014-11-29 VITALS — BP 100/54 | HR 80 | Resp 16 | Ht 61.0 in | Wt 106.0 lb

## 2014-11-29 DIAGNOSIS — G360 Neuromyelitis optica [Devic]: Secondary | ICD-10-CM | POA: Insufficient documentation

## 2014-11-29 DIAGNOSIS — F329 Major depressive disorder, single episode, unspecified: Secondary | ICD-10-CM | POA: Insufficient documentation

## 2014-11-29 DIAGNOSIS — F32A Depression, unspecified: Secondary | ICD-10-CM

## 2014-11-29 MED ORDER — ESCITALOPRAM OXALATE 10 MG PO TABS: ORAL_TABLET | ORAL | Status: DC

## 2014-11-29 NOTE — Patient Instructions (Signed)
1. Continue all your medications 2. Follow-up in 5-6 months, call our office for any changes 3. Have a great summer!
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
## 2015-03-18 ENCOUNTER — Telehealth: Payer: Self-pay | Admitting: Neurology

## 2015-03-18 NOTE — Telephone Encounter (Signed)
I spoke with patient and advise her to get Rx from her doctor at [facility]. She stated she would do that. She hasn't been on prednisone since early summer, but she is wanting to start again. We have received FMLA forms and will process I will call her when they are ready. I did make her aware of $25 fee.

## 2015-03-18 NOTE — Telephone Encounter (Signed)
PT called and wanted to know if she can get a prescription called in for Pretisone and also to lett you know she is faxing over her papers for her FMLA recertification/Dawn CB# [phone]

## 2015-03-18 NOTE — Telephone Encounter (Signed)
Please review

## 2015-03-18 NOTE — Telephone Encounter (Signed)
Because of the bone loss and side effects from Prednisone, would be better if she called [facility] for Prednisone. I do not see it on the note from last September, how much is she taking? Will await FMLA forms. Thanks

## 2015-03-21 ENCOUNTER — Telehealth: Payer: Self-pay | Admitting: Neurology

## 2015-03-21 NOTE — Telephone Encounter (Signed)
Pt called about content of the FMLA/that it includes that info of the relapse//[phone]

## 2015-03-21 NOTE — Telephone Encounter (Signed)
I spoke with patient. She wanted to make sure that we included her episodes of relapse & times for chemotherapy 2 x times a year.

## 2015-06-04 ENCOUNTER — Ambulatory Visit: Payer: BLUE CROSS/BLUE SHIELD | Admitting: Neurology

## 2015-08-28 ENCOUNTER — Ambulatory Visit (INDEPENDENT_AMBULATORY_CARE_PROVIDER_SITE_OTHER): Payer: 59 | Admitting: Neurology

## 2015-08-28 ENCOUNTER — Encounter: Payer: Self-pay | Admitting: Neurology

## 2015-08-28 ENCOUNTER — Telehealth: Payer: 59 | Admitting: Physician Assistant

## 2015-08-28 VITALS — BP 98/70 | HR 71 | Resp 16 | Ht 61.0 in | Wt 110.0 lb

## 2015-08-28 DIAGNOSIS — R399 Unspecified symptoms and signs involving the genitourinary system: Secondary | ICD-10-CM | POA: Diagnosis not present

## 2015-08-28 DIAGNOSIS — F32A Depression, unspecified: Secondary | ICD-10-CM

## 2015-08-28 DIAGNOSIS — G360 Neuromyelitis optica [Devic]: Secondary | ICD-10-CM | POA: Diagnosis not present

## 2015-08-28 DIAGNOSIS — F329 Major depressive disorder, single episode, unspecified: Secondary | ICD-10-CM | POA: Diagnosis not present

## 2015-08-28 MED ORDER — ESCITALOPRAM OXALATE 10 MG PO TABS: ORAL_TABLET | ORAL | Status: DC

## 2015-08-28 MED ORDER — CIPROFLOXACIN HCL 500 MG PO TABS
500.0000 mg | ORAL_TABLET | Freq: Two times a day (BID) | ORAL | Status: DC
Start: 2015-08-28 — End: 2015-12-23

## 2015-08-28 NOTE — Patient Instructions (Signed)
1. Continue current treatment plan 2. Follow-up in 1 year, call our office for any changes
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
## 2015-08-28 NOTE — Progress Notes (Signed)

## 2015-08-29 ENCOUNTER — Ambulatory Visit: Payer: BLUE CROSS/BLUE SHIELD | Admitting: Neurology

## 2015-09-25 DIAGNOSIS — M8580 Other specified disorders of bone density and structure, unspecified site: Secondary | ICD-10-CM | POA: Diagnosis not present

## 2015-09-27 ENCOUNTER — Other Ambulatory Visit
Admission: RE | Admit: 2015-09-27 | Discharge: 2015-09-27 | Disposition: A | Discharge status: Home / Self Care | Payer: 59 | Source: Ambulatory Visit | Attending: Anesthesiology | Admitting: Anesthesiology

## 2015-09-27 DIAGNOSIS — M8580 Other specified disorders of bone density and structure, unspecified site: Secondary | ICD-10-CM | POA: Diagnosis not present

## 2015-09-27 LAB — BASIC METABOLIC PANEL
Anion gap: 8 (ref 5–15)
BUN: 20 mg/dL (ref 6–20)
CALCIUM: 9.3 mg/dL (ref 8.9–10.3)
CO2: 26 mmol/L (ref 22–32)
Chloride: 104 mmol/L (ref 101–111)
Creatinine, Ser: 1.08 mg/dL — ABNORMAL HIGH (ref 0.44–1.00)
GFR calc Af Amer: >60 mL/min (ref >60)
Glucose, Bld: 66 mg/dL (ref 65–99)
Potassium: 3.7 mmol/L (ref 3.5–5.1)
Sodium: 138 mmol/L (ref 135–145)

## 2015-09-29 LAB — VITAMIN D 25 HYDROXY (VIT D DEFICIENCY, FRACTURES): Vit D, 25-Hydroxy: 20.1 ng/mL — ABNORMAL LOW (ref 30.0–100.0)

## 2015-09-30 DIAGNOSIS — G360 Neuromyelitis optica [Devic]: Secondary | ICD-10-CM | POA: Diagnosis not present

## 2015-10-01 LAB — MISC LABCORP TEST (SEND OUT): Labcorp test code: 10249

## 2015-10-03 ENCOUNTER — Other Ambulatory Visit
Admission: RE | Admit: 2015-10-03 | Discharge: 2015-10-03 | Disposition: A | Discharge status: Home / Self Care | Payer: 59 | Source: Ambulatory Visit | Attending: Anesthesiology | Admitting: Anesthesiology

## 2015-10-03 DIAGNOSIS — M8580 Other specified disorders of bone density and structure, unspecified site: Secondary | ICD-10-CM | POA: Insufficient documentation

## 2015-10-06 LAB — MISC LABCORP TEST (SEND OUT): Labcorp test code: 10249

## 2015-10-14 DIAGNOSIS — Z79899 Other long term (current) drug therapy: Secondary | ICD-10-CM | POA: Diagnosis not present

## 2015-10-14 DIAGNOSIS — G360 Neuromyelitis optica [Devic]: Secondary | ICD-10-CM | POA: Diagnosis not present

## 2015-10-31 ENCOUNTER — Encounter: Payer: Self-pay | Admitting: Neurology

## 2015-12-17 ENCOUNTER — Ambulatory Visit (INDEPENDENT_AMBULATORY_CARE_PROVIDER_SITE_OTHER): Payer: 59 | Admitting: Student

## 2015-12-17 ENCOUNTER — Encounter: Payer: Self-pay | Admitting: Student

## 2015-12-17 VITALS — BP 136/70 | HR 73 | Temp 98.0°F | Wt 112.0 lb

## 2015-12-17 DIAGNOSIS — S00419A Abrasion of unspecified ear, initial encounter: Secondary | ICD-10-CM | POA: Diagnosis not present

## 2015-12-17 DIAGNOSIS — H65193 Other acute nonsuppurative otitis media, bilateral: Secondary | ICD-10-CM

## 2015-12-17 DIAGNOSIS — H6590 Unspecified nonsuppurative otitis media, unspecified ear: Secondary | ICD-10-CM | POA: Insufficient documentation

## 2015-12-17 MED ORDER — AMOXICILLIN 500 MG PO CAPS: 500.0000 mg | ORAL_CAPSULE | Freq: Two times a day (BID) | ORAL | Status: DC

## 2015-12-17 MED ORDER — AMOXICILLIN 500 MG PO CAPS: 500.0000 mg | ORAL_CAPSULE | Freq: Three times a day (TID) | ORAL | Status: DC

## 2015-12-17 NOTE — Progress Notes (Signed)
   Subjective:    Patient ID: [name], female    DOB: 1980/07/15, 35 y.o.   MRN: [id number]  CC: Fullness in both ears  HPI #fullnes in both ears: This has been going on for two months. She feels pressures in the ears bilaterally. She reports reduced hearing for one month. She reports some discharge from both ears for one month. She states she went on vacation to [place] in June. She reports being in a water occasionally.  She reports using Q-tips.  Denies fever or chills or postnasal drip. She denies having such problem before 2 months  Patient has history of neuromyelitis optica. She is on rituximab for the last 2 years.   Denies history of seasonal allergy. Reports a little pressure behind her nose.    Review of Systems Objective:   Physical Exam Filed Vitals:   12/17/15 1600  BP: 136/70  Pulse: 73  Temp: 98 F (36.7 C)  TempSrc: Oral  Weight: 112 lb (50.803 kg)    [id number] well, NAD Oropharynx: clear, moist Nares: No rhinorrhea, swelling or erythema Ears: No apparent discharge, ear canal and TM looks whitish and glistening bilaterally. No pain with pulling the pinna. No sign of erythema or swelling around the ear. No sign of mastoiditis Neck: supple, no LAD NEURO: A&O x3, no gross defecits  PSYCH: appropriate mood and affect      Assessment & Plan:  Non-suppurative otitis media Likely otitis media in immunocompromised patient.  She could have otitis externa or swimmer's ears as well. No systemic symptoms. Discussed and examined patient with Dr. Corinna Lines.  -Swab culture from both ears -Gave prescription for amoxicillin 500 mg 3 times daily for 7 days -Discussed return precautions including worsening of symptoms, fever or chills

## 2015-12-17 NOTE — Assessment & Plan Note (Addendum)
Likely otitis media in immunocompromised patient.  She could have otitis externa or swimmer's ears as well. No systemic symptoms. Discussed and examined patient with Dr. Corinna Lines.  -Swab culture from both ears -Gave prescription for amoxicillin 500 mg 3 times daily for 7 days -Discussed return precautions including worsening of symptoms, fever or chills

## 2015-12-17 NOTE — Patient Instructions (Signed)
It was great seeing you today! We have addressed the following issues today  1. Fullness in the ear: We have obtained samples for culture. We will treat you with antibiotic while waiting on culture results. Please return to the clinic if his symptoms worsen or if you feel fever.     If we did any lab work today, and the results require attention, either me or my nurse will get in touch with you. If everything is normal, you will get a letter in mail. If you don't hear from [place] in two weeks, please give [place] a call. Otherwise, I look forward to talking with you again at our next visit. If you have any questions or concerns before then, please call the clinic at [phone].  Please bring all your medications to every doctors visit   Sign up for My Chart to have easy access to your labs results, and communication with your Primary care physician.    Please check-out at the front desk before leaving the clinic.   Take Care,

## 2015-12-21 LAB — EAR CULTURE

## 2015-12-23 ENCOUNTER — Other Ambulatory Visit: Payer: Self-pay | Admitting: Student

## 2015-12-23 DIAGNOSIS — R399 Unspecified symptoms and signs involving the genitourinary system: Secondary | ICD-10-CM

## 2015-12-23 DIAGNOSIS — G360 Neuromyelitis optica [Devic]: Secondary | ICD-10-CM | POA: Diagnosis not present

## 2015-12-23 MED ORDER — NEOMYCIN-POLYMYXIN-HC 3.5-10000-1 OT SOLN: 4.0000 [drp] | Freq: Four times a day (QID) | OTIC | 0 refills | Status: DC

## 2015-12-23 MED ORDER — CIPROFLOXACIN HCL 500 MG PO TABS: 500.0000 mg | ORAL_TABLET | Freq: Two times a day (BID) | ORAL | 0 refills | Status: DC

## 2015-12-23 NOTE — Telephone Encounter (Signed)
Discussed result with Dr. Janace Hoard (ENT on call). He recommended using Corticotropin ear drop. I have added Cipro given her immune status. I have left VM for patient. I have also sent mychart message later on.

## 2016-03-02 DIAGNOSIS — G360 Neuromyelitis optica [Devic]: Secondary | ICD-10-CM | POA: Diagnosis not present

## 2016-03-02 DIAGNOSIS — Z79899 Other long term (current) drug therapy: Secondary | ICD-10-CM | POA: Diagnosis not present

## 2016-03-03 ENCOUNTER — Telehealth: Payer: 59 | Admitting: Family

## 2016-03-03 DIAGNOSIS — J329 Chronic sinusitis, unspecified: Secondary | ICD-10-CM | POA: Diagnosis not present

## 2016-03-03 MED ORDER — AMOXICILLIN-POT CLAVULANATE 875-125 MG PO TABS: 1.0000 | ORAL_TABLET | Freq: Two times a day (BID) | ORAL | 0 refills | Status: DC

## 2016-03-03 NOTE — Progress Notes (Signed)

## 2016-03-16 DIAGNOSIS — Z79899 Other long term (current) drug therapy: Secondary | ICD-10-CM | POA: Diagnosis not present

## 2016-03-16 DIAGNOSIS — G360 Neuromyelitis optica [Devic]: Secondary | ICD-10-CM | POA: Diagnosis not present

## 2016-06-29 DIAGNOSIS — G360 Neuromyelitis optica [Devic]: Secondary | ICD-10-CM | POA: Diagnosis not present

## 2016-06-29 DIAGNOSIS — R5382 Chronic fatigue, unspecified: Secondary | ICD-10-CM | POA: Diagnosis not present

## 2016-08-03 DIAGNOSIS — Z79899 Other long term (current) drug therapy: Secondary | ICD-10-CM | POA: Diagnosis not present

## 2016-08-03 DIAGNOSIS — G360 Neuromyelitis optica [Devic]: Secondary | ICD-10-CM | POA: Diagnosis not present

## 2016-08-12 ENCOUNTER — Telehealth: Payer: Self-pay

## 2016-08-12 NOTE — Telephone Encounter (Signed)
Clld pt - [place] re FMLA paperwork. Need to confirmed she has changed her last name to Beggs from [place].

## 2016-08-17 DIAGNOSIS — G360 Neuromyelitis optica [Devic]: Secondary | ICD-10-CM | POA: Diagnosis not present

## 2016-08-17 DIAGNOSIS — Z79899 Other long term (current) drug therapy: Secondary | ICD-10-CM | POA: Diagnosis not present

## 2016-09-02 ENCOUNTER — Ambulatory Visit: Payer: 59 | Admitting: Neurology

## 2016-09-10 ENCOUNTER — Other Ambulatory Visit: Payer: Self-pay | Admitting: Neurology

## 2016-09-10 DIAGNOSIS — F329 Major depressive disorder, single episode, unspecified: Secondary | ICD-10-CM

## 2016-09-10 DIAGNOSIS — F32A Depression, unspecified: Secondary | ICD-10-CM

## 2016-10-22 ENCOUNTER — Encounter: Payer: Self-pay | Admitting: Emergency Medicine

## 2016-10-22 ENCOUNTER — Inpatient Hospital Stay
Admission: EM | Admit: 2016-10-22 | Discharge: 2016-10-24 | DRG: 059 | Disposition: A | Discharge status: Home Health | Payer: 59 | Attending: Internal Medicine | Admitting: Internal Medicine

## 2016-10-22 ENCOUNTER — Telehealth: Payer: 59 | Admitting: Nurse Practitioner

## 2016-10-22 DIAGNOSIS — N3000 Acute cystitis without hematuria: Secondary | ICD-10-CM | POA: Diagnosis not present

## 2016-10-22 DIAGNOSIS — N390 Urinary tract infection, site not specified: Secondary | ICD-10-CM | POA: Diagnosis not present

## 2016-10-22 DIAGNOSIS — G369 Acute disseminated demyelination, unspecified: Secondary | ICD-10-CM

## 2016-10-22 DIAGNOSIS — G360 Neuromyelitis optica [Devic]: Secondary | ICD-10-CM | POA: Diagnosis not present

## 2016-10-22 DIAGNOSIS — R300 Dysuria: Secondary | ICD-10-CM | POA: Diagnosis not present

## 2016-10-22 DIAGNOSIS — R531 Weakness: Secondary | ICD-10-CM | POA: Diagnosis not present

## 2016-10-22 DIAGNOSIS — R32 Unspecified urinary incontinence: Secondary | ICD-10-CM | POA: Diagnosis not present

## 2016-10-22 DIAGNOSIS — Z79899 Other long term (current) drug therapy: Secondary | ICD-10-CM

## 2016-10-22 DIAGNOSIS — R29898 Other symptoms and signs involving the musculoskeletal system: Secondary | ICD-10-CM

## 2016-10-22 DIAGNOSIS — R159 Full incontinence of feces: Secondary | ICD-10-CM | POA: Diagnosis present

## 2016-10-22 LAB — COMPREHENSIVE METABOLIC PANEL
ALK PHOS: 77 U/L (ref 38–126)
ALT: 13 U/L — ABNORMAL LOW (ref 14–54)
ANION GAP: 8 (ref 5–15)
AST: 26 U/L (ref 15–41)
Albumin: 4.8 g/dL (ref 3.5–5.0)
BUN: 10 mg/dL (ref 6–20)
CALCIUM: 9.1 mg/dL (ref 8.9–10.3)
CO2: 28 mmol/L (ref 22–32)
Chloride: 101 mmol/L (ref 101–111)
Creatinine, Ser: 0.91 mg/dL (ref 0.44–1.00)
GFR calc non Af Amer: >60 mL/min (ref >60)
Glucose, Bld: 89 mg/dL (ref 65–99)
Potassium: 3.6 mmol/L (ref 3.5–5.1)
SODIUM: 137 mmol/L (ref 135–145)
TOTAL PROTEIN: 8.3 g/dL — AB (ref 6.5–8.1)
Total Bilirubin: 0.8 mg/dL (ref 0.3–1.2)

## 2016-10-22 LAB — CBC WITH DIFFERENTIAL/PLATELET
Basophils Absolute: 0.1 10*3/uL (ref 0–0.1)
Basophils Relative: 1 %
Eosinophils Absolute: 0.2 10*3/uL (ref 0–0.7)
Eosinophils Relative: 3 %
HCT: 41.9 % (ref 35.0–47.0)
Hemoglobin: 13.9 g/dL (ref 12.0–16.0)
Lymphocytes Relative: 21 %
Lymphs Abs: 1.3 10*3/uL (ref 1.0–3.6)
MCH: 29.4 pg (ref 26.0–34.0)
MCHC: 33 g/dL (ref 32.0–36.0)
MCV: 89 fL (ref 80.0–100.0)
Monocytes Absolute: 0.5 10*3/uL (ref 0.2–0.9)
Monocytes Relative: 8 %
Neutro Abs: 4 10*3/uL (ref 1.4–6.5)
Neutrophils Relative %: 67 %
Platelets: 162 10*3/uL (ref 150–440)
RBC: 4.71 MIL/uL (ref 3.80–5.20)
RDW: 13.7 % (ref 11.5–14.5)
WBC: 6 10*3/uL (ref 3.6–11.0)

## 2016-10-22 LAB — URINALYSIS, COMPLETE (UACMP) WITH MICROSCOPIC
Bilirubin Urine: NEGATIVE
Glucose, UA: NEGATIVE mg/dL
Hgb urine dipstick: NEGATIVE
Ketones, ur: 5 mg/dL — AB
Nitrite: NEGATIVE
Protein, ur: NEGATIVE mg/dL
Specific Gravity, Urine: 1.019 (ref 1.005–1.030)
pH: 5 (ref 5.0–8.0)

## 2016-10-22 LAB — TROPONIN I: Troponin I: <0.03 ng/mL (ref <0.03)

## 2016-10-22 LAB — POCT PREGNANCY, URINE: Preg Test, Ur: NEGATIVE

## 2016-10-22 MED ORDER — METHYLPREDNISOLONE SODIUM SUCC 125 MG IJ SOLR
125.0000 mg | Freq: Once | INTRAMUSCULAR | Status: AC
  Administered 2016-10-22: 125 mg via INTRAVENOUS
  Filled 2016-10-22: qty 2

## 2016-10-22 MED ORDER — CEFTRIAXONE SODIUM IN DEXTROSE 20 MG/ML IV SOLN
1.0000 g | Freq: Once | INTRAVENOUS | Status: AC
  Administered 2016-10-22: 1 g via INTRAVENOUS
  Filled 2016-10-22: qty 50

## 2016-10-22 MED ORDER — CEPHALEXIN 500 MG PO CAPS: 500.0000 mg | ORAL_CAPSULE | Freq: Two times a day (BID) | ORAL | 0 refills | Status: DC

## 2016-10-22 NOTE — Progress Notes (Signed)

## 2016-10-22 NOTE — ED Notes (Addendum)
Pt ambulatory to tx room in NAD, report hx of neuromuscular disorder, neuromyelitis optica, report over past 10 days gastroparesis, denies vomiting, states just gets full very quickly and feels bloated.  Pt reports today had dysuria and bowel/urinary incontinence, and left leg numbness starting 3 hours ago.  Pt denies pain.  Pt reports slurred speech as well, pt's speech noted to be slower than usual.

## 2016-10-22 NOTE — ED Triage Notes (Signed)
Patient reports having gastroparsis for 2 weeks, tonight at 5 pm started speech difficulty, incontient of urine and stool, numbness in left leg.
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
## 2016-10-23 DIAGNOSIS — R159 Full incontinence of feces: Secondary | ICD-10-CM | POA: Diagnosis present

## 2016-10-23 DIAGNOSIS — G360 Neuromyelitis optica [Devic]: Secondary | ICD-10-CM | POA: Diagnosis not present

## 2016-10-23 DIAGNOSIS — N390 Urinary tract infection, site not specified: Secondary | ICD-10-CM | POA: Diagnosis not present

## 2016-10-23 DIAGNOSIS — R32 Unspecified urinary incontinence: Secondary | ICD-10-CM | POA: Diagnosis present

## 2016-10-23 DIAGNOSIS — Z79899 Other long term (current) drug therapy: Secondary | ICD-10-CM | POA: Diagnosis not present

## 2016-10-23 DIAGNOSIS — R300 Dysuria: Secondary | ICD-10-CM | POA: Diagnosis present

## 2016-10-23 LAB — CBC
HCT: 39.1 % (ref 35.0–47.0)
Hemoglobin: 13.1 g/dL (ref 12.0–16.0)
MCH: 29 pg (ref 26.0–34.0)
MCHC: 33.5 g/dL (ref 32.0–36.0)
MCV: 86.6 fL (ref 80.0–100.0)
PLATELETS: 152 10*3/uL (ref 150–440)
RBC: 4.52 MIL/uL (ref 3.80–5.20)
RDW: 13 % (ref 11.5–14.5)
WBC: 5.6 10*3/uL (ref 3.6–11.0)

## 2016-10-23 LAB — BASIC METABOLIC PANEL
Anion gap: 7 (ref 5–15)
BUN: 10 mg/dL (ref 6–20)
CALCIUM: 8.9 mg/dL (ref 8.9–10.3)
CO2: 25 mmol/L (ref 22–32)
Chloride: 104 mmol/L (ref 101–111)
Creatinine, Ser: 0.79 mg/dL (ref 0.44–1.00)
GFR calc Af Amer: >60 mL/min (ref >60)
GFR calc non Af Amer: >60 mL/min (ref >60)
GLUCOSE: 160 mg/dL — AB (ref 65–99)
POTASSIUM: 3.9 mmol/L (ref 3.5–5.1)
SODIUM: 136 mmol/L (ref 135–145)

## 2016-10-23 MED ORDER — ONDANSETRON HCL 4 MG/2ML IJ SOLN: 4.0000 mg | Freq: Four times a day (QID) | INTRAMUSCULAR | Status: DC | PRN

## 2016-10-23 MED ORDER — ESCITALOPRAM OXALATE 10 MG PO TABS
10.0000 mg | ORAL_TABLET | Freq: Every day | ORAL | Status: DC
  Administered 2016-10-23 – 2016-10-24 (×2): 10 mg via ORAL
  Filled 2016-10-23 (×2): qty 1

## 2016-10-23 MED ORDER — SODIUM CHLORIDE 0.9 % IV SOLN
875.0000 mg | Freq: Once | INTRAVENOUS | Status: AC
  Administered 2016-10-23: 875 mg via INTRAVENOUS
  Filled 2016-10-23: qty 7.04

## 2016-10-23 MED ORDER — ONDANSETRON HCL 4 MG PO TABS: 4.0000 mg | ORAL_TABLET | Freq: Four times a day (QID) | ORAL | Status: DC | PRN

## 2016-10-23 MED ORDER — SODIUM CHLORIDE 0.9 % IV SOLN: 1000.0000 mg | Freq: Every day | INTRAVENOUS | Status: DC

## 2016-10-23 MED ORDER — ACETAMINOPHEN 325 MG PO TABS: 650.0000 mg | ORAL_TABLET | Freq: Four times a day (QID) | ORAL | Status: DC | PRN

## 2016-10-23 MED ORDER — ENOXAPARIN SODIUM 40 MG/0.4ML ~~LOC~~ SOLN
40.0000 mg | SUBCUTANEOUS | Status: DC
  Administered 2016-10-23 – 2016-10-24 (×2): 40 mg via SUBCUTANEOUS
  Filled 2016-10-23 (×2): qty 0.4

## 2016-10-23 MED ORDER — DEXTROSE 5 % IV SOLN
1.0000 g | INTRAVENOUS | Status: DC
  Administered 2016-10-23: 1 g via INTRAVENOUS
  Filled 2016-10-23 (×2): qty 10

## 2016-10-23 MED ORDER — ACETAMINOPHEN 650 MG RE SUPP: 650.0000 mg | Freq: Four times a day (QID) | RECTAL | Status: DC | PRN

## 2016-10-23 MED ORDER — ENSURE ENLIVE PO LIQD
237.0000 mL | Freq: Two times a day (BID) | ORAL | Status: DC
  Administered 2016-10-23 – 2016-10-24 (×3): 237 mL via ORAL

## 2016-10-23 NOTE — H&P (Signed)
[place] [facility] [place] NAME: [name]    MR#:  [id number]  DATE OF BIRTH:  [birth year]  DATE OF ADMISSION:  10/22/2016  PRIMARY CARE PHYSICIAN: Tonette Bihari, MD   REQUESTING/REFERRING PHYSICIAN: Alfred Levins, MD  CHIEF COMPLAINT:   Chief Complaint  Patient presents with  . Dysuria  . Speech Problem  . Numbness    HISTORY OF PRESENT ILLNESS:  [name]  is a 36 y.o. female who presents with Dysuria, followed by urinary incontinence and leg numbness. Patient has history of neuromyelitis optica, he states that these symptoms are consistent with a flareup. She states that earlier in the day she did have some transient word finding difficulty, but this has now resolved. Hospitalists were called for admission  PAST MEDICAL HISTORY:   Past Medical History:  Diagnosis Date  . Nondiabetic gastroparesis 2008   idiopathic, resolved  . Optic neuritis   . Ovarian cyst     PAST SURGICAL HISTORY:   Past Surgical History:  Procedure Laterality Date  . TONSILLECTOMY    . TUBAL LIGATION  2005    SOCIAL HISTORY:   Social History  Substance Use Topics  . Smoking status: Never Smoker  . Smokeless tobacco: Never Used  . Alcohol use 0.0 oz/week     Comment: social wine    FAMILY HISTORY:   Family History  Problem Relation Age of Onset  . Cancer Mother        Lung   . Renal Disease Sister        ESRD s/p kidney transplant  . Thyroid cancer Sister        papillary thyroid cancer, same sister with ESRD s/   . Stroke Paternal Grandfather 90       multiple strokes     DRUG ALLERGIES:  No Known Allergies  MEDICATIONS AT HOME:   Prior to Admission medications   Medication Sig Start Date End Date Taking? Authorizing Provider  ibuprofen (ADVIL,MOTRIN) 200 MG tablet 2 tablets daily as needed   Yes [provider]  cephALEXin (KEFLEX) 500 MG capsule Take 1 capsule (500 mg total) by mouth 2 (two) times  daily. 10/22/16   Chevis Pretty, FNP  escitalopram (LEXAPRO) 10 MG tablet TAKE 1 TABLET BY MOUTH ONCE DAILY 09/10/16   Cameron Sprang, MD  methylphenidate (RITALIN) 10 MG tablet Take 10 mg by mouth daily. 08/31/16   [provider]  riTUXimab in sodium chloride 0.9 % 250 mL Inject 1,000 mg into the vein once. Every 5 months    [provider]  UNABLE TO FIND caltrate plus vit D chocolate truffle supplements daily, takes 2 daily    [provider]  Zoledronic Acid (RECLAST IV) Inject into the vein. Reported on 08/28/2015    [provider]    REVIEW OF SYSTEMS:  Review of Systems  Constitutional: Negative for chills, fever, malaise/fatigue and weight loss.  HENT: Negative for ear pain, hearing loss and tinnitus.   Eyes: Negative for blurred vision, double vision, pain and redness.  Respiratory: Negative for cough, hemoptysis and shortness of breath.   Cardiovascular: Negative for chest pain, palpitations, orthopnea and leg swelling.  Gastrointestinal: Negative for abdominal pain, constipation, diarrhea, nausea and vomiting.  Genitourinary: Positive for dysuria. Negative for frequency and hematuria.  Musculoskeletal: Negative for back pain, joint pain and neck pain.  Skin:       No acne, rash, or lesions  Neurological: Positive for sensory change.  Negative for dizziness, tremors, focal weakness and weakness.  Endo/Heme/Allergies: Negative for polydipsia. Does not bruise/bleed easily.  Psychiatric/Behavioral: Negative for depression. The patient is not nervous/anxious and does not have insomnia.      VITAL SIGNS:   Vitals:   10/22/16 2239 10/22/16 2310 10/22/16 2330 10/23/16 0000  BP:  122/71 138/85 117/73  Pulse:  69 70 66  Resp:   14 (!) 9  Temp:      TempSrc:      SpO2:  100% 100% 100%  Weight: 45.4 kg (100 lb)     Height: 5\' 2"  (1.575 m)      Wt Readings from Last 3 Encounters:  10/22/16 45.4 kg (100 lb)  12/17/15 50.8 kg (112 lb)   08/28/15 49.9 kg (110 lb)    PHYSICAL EXAMINATION:  Physical Exam  Vitals reviewed. Constitutional: She is oriented to person, place, and time. She appears well-developed and well-nourished. No distress.  HENT:  Head: Normocephalic and atraumatic.  Mouth/Throat: Oropharynx is clear and moist.  Eyes: Conjunctivae and EOM are normal. Pupils are equal, round, and reactive to light. No scleral icterus.  Neck: Normal range of motion. Neck supple. No JVD present. No thyromegaly present.  Cardiovascular: Normal rate, regular rhythm and intact distal pulses.  Exam reveals no gallop and no friction rub.   No murmur heard. Respiratory: Effort normal and breath sounds normal. No respiratory distress. She has no wheezes. She has no rales.  GI: Soft. Bowel sounds are normal. She exhibits no distension. There is tenderness (Low abdomen).  Musculoskeletal: Normal range of motion. She exhibits no edema.  No arthritis, no gout  Lymphadenopathy:    She has no cervical adenopathy.  Neurological: She is alert and oriented to person, place, and time. No cranial nerve deficit.  No dysarthria, no aphasia, patient endorses some leg numbness  Skin: Skin is warm and dry. No rash noted. No erythema.  Psychiatric: She has a normal mood and affect. Her behavior is normal. Judgment and thought content normal.    LABORATORY PANEL:   CBC  Recent Labs Lab 10/22/16 2259  WBC 6.0  HGB 13.9  HCT 41.9  PLT 162   ------------------------------------------------------------------------------------------------------------------  Chemistries   Recent Labs Lab 10/22/16 2259  NA 137  K 3.6  CL 101  CO2 28  GLUCOSE 89  BUN 10  CREATININE 0.91  CALCIUM 9.1  AST 26  ALT 13*  ALKPHOS 77  BILITOT 0.8   ------------------------------------------------------------------------------------------------------------------  Cardiac Enzymes  Recent Labs Lab 10/22/16 2259  TROPONINI <0.03    ------------------------------------------------------------------------------------------------------------------  RADIOLOGY:  No results found.  EKG:   Orders placed or performed during the hospital encounter of 10/22/16  . ED EKG  . ED EKG  . EKG 12-Lead  . EKG 12-Lead    IMPRESSION AND PLAN:  Principal Problem:   Neuromyelitis optica ([place]) - high-dose IV steroids, neurology consult, treat UTI as below Active Problems:   UTI (urinary tract infection) - IV Rocephin, urine culture sent  All the records are reviewed and case discussed with ED provider. Management plans discussed with the patient and/or family.  DVT PROPHYLAXIS: SubQ lovenox  GI PROPHYLAXIS: None  ADMISSION STATUS: Inpatient  CODE STATUS: Full Code Status History    Date Active Date Inactive Code Status Order ID Comments User Context   09/24/2013 12:52 AM 09/25/2013  5:39 PM Full Code [id number]  Street, Sharon Mt, MD Inpatient      TOTAL TIME TAKING CARE OF THIS PATIENT: 45 minutes.  [name] 10/23/2016, 12:15 AM  [facility]  Office  [phone]  CC: Primary care physician; Tonette Bihari, MD  Note:  This document was prepared using Dragon voice recognition software and may include unintentional dictation errors.

## 2016-10-23 NOTE — Progress Notes (Signed)
The patient complains of dysuria and reduced sensation on the left leg.  She feels much better generally. Vital signs is normal. Physical examination she is unremarkable except for reduced the sensation on left leg. She can control her urine.    Neuromyelitis optica ([place]) -  She was treated withhigh-dose IV steroids.  Per neurology consult, discontinued IV Solu-Medrol due to UTI.  If does not continue to improve by tomorrow. Restart solumderol and finish 3 day course.    UTI (urinary tract infection)  Continue IV Rocephin, follow-up urine culture.  Discussed with the patient and RN. Possible discharge home tomorrow.

## 2016-10-23 NOTE — Consult Note (Signed)
Reason for Consult:L Leg numbness/ weakness  Referring Physician: Dr. Bridgett Larsson  CC: L leg weakness   HPI: [name] is an 36 y.o. female with history NMO diagnosed in 2014 at which point had optic neuritis who presents with Dysuria, followed by urinary incontinence and leg numbness which has improved.  Pt had one episode of exacerbation in past and was started on steroids with similar complaints.   Pt was found to have UTI and started on antibiotics.    Past Medical History:  Diagnosis Date  . Nondiabetic gastroparesis 2008   idiopathic, resolved  . Optic neuritis   . Ovarian cyst     Past Surgical History:  Procedure Laterality Date  . TONSILLECTOMY    . TUBAL LIGATION  2005    Family History  Problem Relation Age of Onset  . Cancer Mother        Lung   . Renal Disease Sister        ESRD s/p kidney transplant  . Thyroid cancer Sister        papillary thyroid cancer, same sister with ESRD s/   . Stroke Paternal Grandfather 90       multiple strokes     Social History:  reports that she has never smoked. She has never used smokeless tobacco. She reports that she drinks alcohol. She reports that she does not use drugs.  No Known Allergies  Medications: I have reviewed the patient's current medications.  ROS: History obtained from the patient  General ROS: negative for - chills, fatigue, fever, night sweats, weight gain or weight loss Psychological ROS: negative for - behavioral disorder, hallucinations, memory difficulties, mood swings or suicidal ideation Ophthalmic ROS: negative for - blurry vision, double vision, eye pain or loss of vision ENT ROS: negative for - epistaxis, nasal discharge, oral lesions, sore throat, tinnitus or vertigo Allergy and Immunology ROS: negative for - hives or itchy/watery eyes Hematological and Lymphatic ROS: negative for - bleeding problems, bruising or swollen lymph nodes Endocrine ROS: negative for - galactorrhea, hair pattern  changes, polydipsia/polyuria or temperature intolerance Respiratory ROS: negative for - cough, hemoptysis, shortness of breath or wheezing Cardiovascular ROS: negative for - chest pain, dyspnea on exertion, edema or irregular heartbeat Gastrointestinal ROS: negative for - abdominal pain, diarrhea, hematemesis, nausea/vomiting or stool incontinence Genito-Urinary ROS: negative for - dysuria, hematuria, incontinence or urinary frequency/urgency Musculoskeletal ROS: negative for - joint swelling or muscular weakness Neurological ROS: as noted in HPI Dermatological ROS: negative for rash and skin lesion changes  Physical Examination: Blood pressure 110/60, pulse 71, temperature 98.3 F (36.8 C), temperature source Oral, resp. rate 20, height 5\' 2"  (1.575 m), weight 45.4 kg (100 lb), last menstrual period 10/15/2016, SpO2 99 %.  HEENT-  Normocephalic, no lesions, without obvious abnormality.  Normal external eye and conjunctiva.  Normal TM's bilaterally.  Normal auditory canals and external ears. Normal external nose, mucus membranes and septum.  Normal pharynx. Cardiovascular- regular rate and rhythm, S1, S2 normal, no murmur, click, rub or gallop, pulses palpable throughout   Lungs- Heart exam - S1, S2 normal, no murmur, no gallop, rate regular Abdomen- soft, non-tender; bowel sounds normal; no masses,  no organomegaly Extremities- less then 2 second capillary refill Lymph-no adenopathy palpable Musculoskeletal-no joint tenderness, deformity or swelling Skin-warm and dry, no hyperpigmentation, vitiligo, or suspicious lesions  Neurological Examination   Mental Status: Alert, oriented, thought content appropriate.  Speech fluent without evidence of aphasia.  Able to follow 3 step commands  without difficulty. Cranial Nerves: II: Discs flat bilaterally; Visual fields grossly normal, pupils equal, round, reactive to light and accommodation III,IV, VI: ptosis not present, extra-ocular motions  intact bilaterally V,VII: smile symmetric, facial light touch sensation normal bilaterally VIII: hearing normal bilaterally IX,X: gag reflex present XI: bilateral shoulder shrug XII: midline tongue extension Motor: Right : Upper extremity   5/5    Left:     Upper extremity   5/5  Lower extremity   5/5     Lower extremity   4/5 Tone and bulk:normal tone throughout; no atrophy noted Sensory: Slight decrease on LLE to temperature Deep Tendon Reflexes: 2+ and symmetric throughout Plantars: Right: downgoing   Left: downgoing Cerebellar: normal finger-to-nose, normal rapid alternating movements and normal heel-to-shin test Gait: normal gait and station      Laboratory Studies:   Basic Metabolic Panel:  Recent Labs Lab 10/22/16 2259 10/23/16 0527  NA 137 136  K 3.6 3.9  CL 101 104  CO2 28 25  GLUCOSE 89 160*  BUN 10 10  CREATININE 0.91 0.79  CALCIUM 9.1 8.9    Liver Function Tests:  Recent Labs Lab 10/22/16 2259  AST 26  ALT 13*  ALKPHOS 77  BILITOT 0.8  PROT 8.3*  ALBUMIN 4.8   No results for input(s): LIPASE, AMYLASE in the last 168 hours. No results for input(s): AMMONIA in the last 168 hours.  CBC:  Recent Labs Lab 10/22/16 2259 10/23/16 0527  WBC 6.0 5.6  NEUTROABS 4.0  --   HGB 13.9 13.1  HCT 41.9 39.1  MCV 89.0 86.6  PLT 162 152    Cardiac Enzymes:  Recent Labs Lab 10/22/16 2259  TROPONINI <0.03    BNP: Invalid input(s): POCBNP  CBG: No results for input(s): GLUCAP in the last 168 hours.  Microbiology: Results for orders placed or performed in visit on 12/17/15  Ear Culture     Status: None   Collection Time: 12/17/15  5:07 PM  Result Value Ref Range Status   Culture   Final    Abundant PSEUDOMONAS AERUGINOSA Abundant STAPHYLOCOCCUS AUREUS    Organism ID, Bacteria PSEUDOMONAS AERUGINOSA  Final   Organism ID, Bacteria STAPHYLOCOCCUS AUREUS  Final    Comment: Rifampin and Gentamicin should not be used as single drugs for  treatment of Staph infections.       Susceptibility   Pseudomonas aeruginosa -  (no method available)    PIP/TAZO <=4 Sensitive     IMIPENEM <=0.25 Sensitive     CEFTRIAXONE  Resistant     CEFTAZIDIME <=1 Sensitive     CEFEPIME <=1 Sensitive     GENTAMICIN <=1 Sensitive     TOBRAMYCIN <=1 Sensitive     CIPROFLOXACIN 1 Sensitive     LEVOFLOXACIN 4 Intermediate    Staphylococcus aureus -  (no method available)    OXACILLIN 0.5 Sensitive     CEFAZOLIN  Sensitive     GENTAMICIN <=0.5 Sensitive     CIPROFLOXACIN >=8 Resistant     LEVOFLOXACIN >=8 Resistant     MOXIFLOXACIN >=8 Resistant     TRIMETH/SULFA <=10 Sensitive     VANCOMYCIN 1 Sensitive     CLINDAMYCIN >=8 Resistant     ERYTHROMYCIN >=8 Resistant     LINEZOLID 2 Sensitive     QUINUPRISTIN/DALF <=0.25 Sensitive     RIFAMPIN <=0.5 Sensitive     TETRACYCLINE <=1 Sensitive     Coagulation Studies: No results for input(s): LABPROT, INR in the last 72  hours.  Urinalysis:  Recent Labs Lab 10/22/16 2308  COLORURINE AMBER*  LABSPEC 1.019  PHURINE 5.0  GLUCOSEU NEGATIVE  HGBUR NEGATIVE  BILIRUBINUR NEGATIVE  KETONESUR 5*  PROTEINUR NEGATIVE  NITRITE NEGATIVE  LEUKOCYTESUR TRACE*    Lipid Panel:  No results found for: CHOL, TRIG, HDL, CHOLHDL, VLDL, LDLCALC  HgbA1C:  Lab Results  Component Value Date   HGBA1C 5.1 09/24/2013    Urine Drug Screen:  No results found for: LABOPIA, COCAINSCRNUR, LABBENZ, AMPHETMU, THCU, LABBARB  Alcohol Level: No results for input(s): ETH in the last 168 hours.  Other results: EKG: normal EKG, normal sinus rhythm, unchanged from previous tracings.  Imaging: No results found.   Assessment/Plan:  36 y.o. female with history NMO diagnosed in 2014 at which point had optic neuritis who presents with Dysuria, followed by urinary incontinence and leg numbness which has improved.  Pt had one episode of exacerbation in past and was started on steroids with similar complaints.   Pt was  found to have UTI and started on antibiotics.    - Pt is much better today - UTI on rocephin - Would not treat with steroids in setting of UTI - Observation for another night and possible d/c tomorrow depending on PT/OT - symptoms improving and should improved with antibiotic treatment - I have stopped solumedrol.  If does not continue to improve by tomorrow. Restart solumderol and finish 3 day course.   Leotis Pain  10/23/2016, 11:33 AM

## 2016-10-23 NOTE — Progress Notes (Addendum)
Initial Nutrition Assessment  DOCUMENTATION CODES:   Underweight  INTERVENTION:  1. Ensure Enlive po BID, each supplement provides 350 kcal and 20 grams of protein  NUTRITION DIAGNOSIS:   Inadequate oral intake related to poor appetite as evidenced by per patient/family report  GOAL:   Patient will meet greater than or equal to 90% of their needs  MONITOR:   PO intake, I & O's, Labs, Skin, Supplement acceptance  REASON FOR ASSESSMENT:   Malnutrition Screening Tool    ASSESSMENT:   [name]  is a 36 y.o. female who presents with Dysuria, followed by urinary incontinence and leg numbness. Patient has history of neuromyelitis optica, he states that these symptoms are consistent with a flareup  Spoke with Ms. Fouche, Husband at bedside. She reports drinking an ensure in the morning and then eating maybe 1 meal later in the day, along with an ensure, for the past 2 weeks. States she had a gastroparesis flare-up. Normally she will do liquids whenever she has a flare-up, followed by the BRAT diet, and then eventually gets back to eating a normal diet. Unfortunately, this flare-up did not pass as quickly. Reports a 20# wt loss over the past 2 months with 12# of that being in the last 10 days, but according to chart she was 104# as of 06/29/2016 She is eating better today, had bacon, eggs, grits, potatoes this morning. Also requesting Ensure, ordered lunch but was not hungry - asking for ensure if she does not feel like eating. Nutrition-Focused physical exam completed. Findings are no fat depletion, no muscle depletion, and no edema.  Labs and medications reviewed  Diet Order:  Diet regular Room service appropriate? Yes; Fluid consistency: Thin  Skin:  Reviewed, no issues  Last BM:  PTA  Height:   Ht Readings from Last 1 Encounters:  10/22/16 5\' 2"  (1.575 m)    Weight:   Wt Readings from Last 1 Encounters:  10/22/16 100 lb (45.4 kg)    Ideal Body Weight:  50  kg  BMI:  Body mass index is 18.29 kg/m.  Estimated Nutritional Needs:   Kcal:  1200-1400 calories  Protein:  45-55 gm  Fluid:  >/= 1.2L  EDUCATION NEEDS:   No education needs identified at this time  Satira Anis. Ammy Lienhard, MS, RD LDN Inpatient Clinical Dietitian Pager [phone]

## 2016-10-23 NOTE — Progress Notes (Signed)
Pharmacy Antibiotic Note  [name] is a 36 y.o. female admitted on 10/22/2016 with UTI.  Pharmacy has been consulted for ceftriaxone dosing.  Plan: Will start patient on ceftriaxone 1g IV daily  Height: 5\' 2"  (157.5 cm) Weight: 100 lb (45.4 kg) IBW/kg (Calculated) : 50.1  Temp (24hrs), Avg:98.3 F (36.8 C), Min:98.1 F (36.7 C), Max:98.4 F (36.9 C)   Recent Labs Lab 10/22/16 2259  WBC 6.0  CREATININE 0.91    Estimated Creatinine Clearance: 61.8 mL/min (by C-G formula based on SCr of 0.91 mg/dL).    No Known Allergies   Thank you for allowing pharmacy to be a part of this patient's care.  Tobie Lords, PharmD, BCPS Clinical Pharmacist 10/23/2016

## 2016-10-24 LAB — URINE CULTURE: CULTURE: NO GROWTH

## 2016-10-24 MED ORDER — CEPHALEXIN 500 MG PO CAPS: 500.0000 mg | ORAL_CAPSULE | Freq: Two times a day (BID) | ORAL | 0 refills | Status: DC

## 2016-10-24 NOTE — Progress Notes (Addendum)
[place] [facility] [place] was admitted to the [facility] on 10/22/2016 and Discharged  10/24/2016 and should be excused from work/school   for 14  days starting 10/22/2016 , until see PCP.  Demetrios Loll M.D on 10/24/2016,at 9:11 AM  [place] [facility]  [phone]

## 2016-10-24 NOTE — Care Management Note (Signed)
Case Management Note  Patient Details  Name: [name] MRN: [id number] Date of Birth: 1981/02/12  Subjective/Objective:      Discharged home today with no home health and no new medication orders.  Noother needs identified.             Action/Plan:   Expected Discharge Date:  10/24/16               Expected Discharge Plan:   10/24/16  In-House Referral:   Consult for meds for insured patient  Discharge planning Services   Chart review  Post Acute Care Choice:   na Choice offered to:   na  DME Arranged:   na DME Agency:   na  HH Arranged:   na HH Agency:   na  Status of Service:   Completed  If discussed at Long Length of Stay Meetings, dates discussed:    Additional Comments:  Kaylla Cobos A, RN 10/24/2016, 10:36 AM

## 2016-10-24 NOTE — Discharge Instructions (Signed)
Urinary Tract Infection, Adult A urinary tract infection (UTI) is an infection of any part of the urinary tract. The urinary tract includes the:  Kidneys.  Ureters.  Bladder.  Urethra. These organs make, store, and get rid of pee (urine) in the body. Follow these instructions at home:  Take over-the-counter and prescription medicines only as told by your doctor.  If you were prescribed an antibiotic medicine, take it as told by your doctor. Do not stop taking the antibiotic even if you start to feel better.  Avoid the following drinks:  Alcohol.  Caffeine.  Tea.  Carbonated drinks.  Drink enough fluid to keep your pee clear or pale yellow.  Keep all follow-up visits as told by your doctor. This is important.  Make sure to:  Empty your bladder often and completely. Do not to hold pee for long periods of time.  Empty your bladder before and after sex.  Wipe from front to back after a bowel movement if you are female. Use each tissue one time when you wipe. Contact a doctor if:  You have back pain.  You have a fever.  You feel sick to your stomach (nauseous).  You throw up (vomit).  Your symptoms do not get better after 3 days.  Your symptoms go away and then come back. Get help right away if:  You have very bad back pain.  You have very bad lower belly (abdominal) pain.  You are throwing up and cannot keep down any medicines or water. This information is not intended to replace advice given to you by your health care provider. Make sure you discuss any questions you have with your health care provider. Document Released: 11/03/2007 Document Revised: 10/23/2015 Document Reviewed: 04/07/2015 Elsevier Interactive Patient Education  2017 [employer]. Cephalexin tablets or capsules What is this medicine? CEPHALEXIN (sef a LEX in) is a cephalosporin antibiotic. It is used to treat certain kinds of bacterial infections It will not work for colds, flu, or  other viral infections. This medicine may be used for other purposes; ask your health care provider or pharmacist if you have questions. COMMON BRAND NAME(S): Biocef, Daxbia, Keflex, Keftab What should I tell my health care provider before I take this medicine? They need to know if you have any of these conditions: -kidney disease -stomach or intestine problems, especially colitis -an unusual or allergic reaction to cephalexin, other cephalosporins, penicillins, other antibiotics, medicines, foods, dyes or preservatives -pregnant or trying to get pregnant -breast-feeding How should I use this medicine? Take this medicine by mouth with a full glass of water. Follow the directions on the prescription label. This medicine can be taken with or without food. Take your medicine at regular intervals. Do not take your medicine more often than directed. Take all of your medicine as directed even if you think you are better. Do not skip doses or stop your medicine early. Talk to your pediatrician regarding the use of this medicine in children. While this drug may be prescribed for selected conditions, precautions do apply. Overdosage: If you think you have taken too much of this medicine contact a poison control center or emergency room at once. NOTE: This medicine is only for you. Do not share this medicine with others. What if I miss a dose? If you miss a dose, take it as soon as you can. If it is almost time for your next dose, take only that dose. Do not take double or extra doses. There should be  at least 4 to 6 hours between doses. What may interact with this medicine? -probenecid -some other antibiotics This list may not describe all possible interactions. Give your health care provider a list of all the medicines, herbs, non-prescription drugs, or dietary supplements you use. Also tell them if you smoke, drink alcohol, or use illegal drugs. Some items may interact with your medicine. What should I  watch for while using this medicine? Tell your doctor or health care professional if your symptoms do not begin to improve in a few days. Do not treat diarrhea with over the counter products. Contact your doctor if you have diarrhea that lasts more than 2 days or if it is severe and watery. If you have diabetes, you may get a false-positive result for sugar in your urine. Check with your doctor or health care professional. What side effects may I notice from receiving this medicine? Side effects that you should report to your doctor or health care professional as soon as possible: -allergic reactions like skin rash, itching or hives, swelling of the face, lips, or tongue -breathing problems -pain or trouble passing urine -redness, blistering, peeling or loosening of the skin, including inside the mouth -severe or watery diarrhea -unusually weak or tired -yellowing of the eyes, skin Side effects that usually do not require medical attention (report to your doctor or health care professional if they continue or are bothersome): -gas or heartburn -genital or anal irritation -headache -joint or muscle pain -nausea, vomiting This list may not describe all possible side effects. Call your doctor for medical advice about side effects. You may report side effects to FDA at 1-800-FDA-1088. Where should I keep my medicine? Keep out of the reach of children. Store at room temperature between 59 and 86 degrees F (15 and 30 degrees C). Throw away any unused medicine after the expiration date. NOTE: This sheet is a summary. It may not cover all possible information. If you have questions about this medicine, talk to your doctor, pharmacist, or health care provider.  2018 Elsevier/Gold Standard (2007-08-21 17:09:13)

## 2016-10-24 NOTE — Consult Note (Signed)
Reason for Consult:L Leg numbness/ weakness  Referring Physician: Dr. Bridgett Larsson  CC: L leg weakness   States feels better today L Leg weakness resolved.   Past Medical History:  Diagnosis Date  . Nondiabetic gastroparesis 2008   idiopathic, resolved  . Optic neuritis   . Ovarian cyst     Past Surgical History:  Procedure Laterality Date  . TONSILLECTOMY    . TUBAL LIGATION  2005    Family History  Problem Relation Age of Onset  . Cancer Mother        Lung   . Renal Disease Sister        ESRD s/p kidney transplant  . Thyroid cancer Sister        papillary thyroid cancer, same sister with ESRD s/   . Stroke Paternal Grandfather 90       multiple strokes     Social History:  reports that she has never smoked. She has never used smokeless tobacco. She reports that she drinks alcohol. She reports that she does not use drugs.  No Known Allergies  Medications: I have reviewed the patient's current medications.  ROS: History obtained from the patient  General ROS: negative for - chills, fatigue, fever, night sweats, weight gain or weight loss Psychological ROS: negative for - behavioral disorder, hallucinations, memory difficulties, mood swings or suicidal ideation Ophthalmic ROS: negative for - blurry vision, double vision, eye pain or loss of vision ENT ROS: negative for - epistaxis, nasal discharge, oral lesions, sore throat, tinnitus or vertigo Allergy and Immunology ROS: negative for - hives or itchy/watery eyes Hematological and Lymphatic ROS: negative for - bleeding problems, bruising or swollen lymph nodes Endocrine ROS: negative for - galactorrhea, hair pattern changes, polydipsia/polyuria or temperature intolerance Respiratory ROS: negative for - cough, hemoptysis, shortness of breath or wheezing Cardiovascular ROS: negative for - chest pain, dyspnea on exertion, edema or irregular heartbeat Gastrointestinal ROS: negative for - abdominal pain, diarrhea, hematemesis,  nausea/vomiting or stool incontinence Genito-Urinary ROS: negative for - dysuria, hematuria, incontinence or urinary frequency/urgency Musculoskeletal ROS: negative for - joint swelling or muscular weakness Neurological ROS: as noted in HPI Dermatological ROS: negative for rash and skin lesion changes  Physical Examination: Blood pressure (!) 98/58, pulse 62, temperature 98.3 F (36.8 C), temperature source Oral, resp. rate 16, height 5\' 2"  (1.575 m), weight 45.4 kg (100 lb), last menstrual period 10/15/2016, SpO2 99 %.  HEENT-  Normocephalic, no lesions, without obvious abnormality.  Normal external eye and conjunctiva.  Normal TM's bilaterally.  Normal auditory canals and external ears. Normal external nose, mucus membranes and septum.  Normal pharynx. Cardiovascular- regular rate and rhythm, S1, S2 normal, no murmur, click, rub or gallop, pulses palpable throughout   Lungs- Heart exam - S1, S2 normal, no murmur, no gallop, rate regular Abdomen- soft, non-tender; bowel sounds normal; no masses,  no organomegaly Extremities- less then 2 second capillary refill Lymph-no adenopathy palpable Musculoskeletal-no joint tenderness, deformity or swelling Skin-warm and dry, no hyperpigmentation, vitiligo, or suspicious lesions  Neurological Examination   Mental Status: Alert, oriented, thought content appropriate.  Speech fluent without evidence of aphasia.  Able to follow 3 step commands without difficulty. Cranial Nerves: II: Discs flat bilaterally; Visual fields grossly normal, pupils equal, round, reactive to light and accommodation III,IV, VI: ptosis not present, extra-ocular motions intact bilaterally V,VII: smile symmetric, facial light touch sensation normal bilaterally VIII: hearing normal bilaterally IX,X: gag reflex present XI: bilateral shoulder shrug XII: midline tongue extension Motor: Right :  Upper extremity   5/5    Left:     Upper extremity   5/5  Lower extremity    5/5     Lower extremity   5/5 Tone and bulk:normal tone throughout; no atrophy noted Sensory: Slight decrease on LLE to temperature Deep Tendon Reflexes: 2+ and symmetric throughout Plantars: Right: downgoing   Left: downgoing Cerebellar: normal finger-to-nose, normal rapid alternating movements and normal heel-to-shin test Gait: normal gait and station      Laboratory Studies:   Basic Metabolic Panel:  Recent Labs Lab 10/22/16 2259 10/23/16 0527  NA 137 136  K 3.6 3.9  CL 101 104  CO2 28 25  GLUCOSE 89 160*  BUN 10 10  CREATININE 0.91 0.79  CALCIUM 9.1 8.9    Liver Function Tests:  Recent Labs Lab 10/22/16 2259  AST 26  ALT 13*  ALKPHOS 77  BILITOT 0.8  PROT 8.3*  ALBUMIN 4.8   No results for input(s): LIPASE, AMYLASE in the last 168 hours. No results for input(s): AMMONIA in the last 168 hours.  CBC:  Recent Labs Lab 10/22/16 2259 10/23/16 0527  WBC 6.0 5.6  NEUTROABS 4.0  --   HGB 13.9 13.1  HCT 41.9 39.1  MCV 89.0 86.6  PLT 162 152    Cardiac Enzymes:  Recent Labs Lab 10/22/16 2259  TROPONINI <0.03    BNP: Invalid input(s): POCBNP  CBG: No results for input(s): GLUCAP in the last 168 hours.  Microbiology: Results for orders placed or performed during the hospital encounter of 10/22/16  Urine culture     Status: None   Collection Time: 10/22/16 11:08 PM  Result Value Ref Range Status   Specimen Description URINE, RANDOM  Final   Special Requests NONE  Final   Culture   Final    NO GROWTH Performed at [facility], [address] [place], [place] [address]    Report Status 10/24/2016 FINAL  Final    Coagulation Studies: No results for input(s): LABPROT, INR in the last 72 hours.  Urinalysis:   Recent Labs Lab 10/22/16 2308  COLORURINE AMBER*  LABSPEC 1.019  PHURINE 5.0  GLUCOSEU NEGATIVE  HGBUR NEGATIVE  BILIRUBINUR NEGATIVE  KETONESUR 5*  PROTEINUR NEGATIVE  NITRITE NEGATIVE  LEUKOCYTESUR TRACE*     Lipid Panel:  No results found for: CHOL, TRIG, HDL, CHOLHDL, VLDL, LDLCALC  HgbA1C:  Lab Results  Component Value Date   HGBA1C 5.1 09/24/2013    Urine Drug Screen:  No results found for: LABOPIA, COCAINSCRNUR, LABBENZ, AMPHETMU, THCU, LABBARB  Alcohol Level: No results for input(s): ETH in the last 168 hours.  Other results: EKG: normal EKG, normal sinus rhythm, unchanged from previous tracings.  Imaging: No results found.   Assessment/Plan:  36 y.o. female with history NMO diagnosed in 2014 at which point had optic neuritis who presents with Dysuria, followed by urinary incontinence and leg numbness which has improved.  Pt had one episode of exacerbation in past and was started on steroids with similar complaints.   Pt was found to have UTI and started on antibiotics.    Leg weakness resolved as per pt and some mild sensory deficits. Being treated for UTI. Hold off steroids Pt/ot for ambulation and d/c planning  [name]  10/24/2016, 9:07 AM

## 2016-10-24 NOTE — Progress Notes (Signed)
Patient discharged via wheelchair and private vehicle. IV removed and catheter intact. All discharge instructions given and patient verbalizes understanding. Tele removed and returned. prescriptions given to patient No distress noted.    

## 2016-10-24 NOTE — Progress Notes (Signed)
Physical Therapy Evaluation Patient Details Name: [name] MRN: [id number] DOB: 23-Jan-1981 Today's Date: 10/24/2016   History of Present Illness  Patient is a 36 y.o. female admitted on 25 May for dysuria with uriniary incontinence and leg numbness. Admittedly displayed word finding issues for a few minutes. PMH includes neuromyelitis optica.  Clinical Impression  Patient is a pleasant female admitted for above listed reasons. 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 Patient admittedly has fallen without injury and has nights where she sleeps downstairs, too fatigued to ascend stairs to bedroom. On evaluation, patient demonstrates decreased sensation to LT on L LE L2-3 and L5-S1. She has notable strength deficits in L hip musculature, substituting with trunk flexion/abdominal activation. While patient is at baseline level of function, she and her husband are agreeable to HHPT to educate in endurance and strength training to improve overall quality of life, allowing for improved activity tolerance with children.    Follow Up Recommendations Home health PT    Equipment Recommendations  None recommended by PT    Recommendations for Other Services       Precautions / Restrictions Precautions Precautions: None Restrictions Weight Bearing Restrictions: No      Mobility  Bed Mobility Overal bed mobility: Independent             General bed mobility comments: Patient performs bed mobility independently.  Transfers Overall transfer level: Independent Equipment used: None             General transfer comment: Patient performs sit to stand and stand to sit independently.  Ambulation/Gait Ambulation/Gait assistance: Min guard Ambulation Distance (Feet): 120 Feet Assistive device: None       General Gait Details: Patient ambulates at decreased cadence, demonstrating slight hip  hike on L.  Stairs            Wheelchair Mobility    Modified Rankin (Stroke Patients Only)       Balance Overall balance assessment: Independent;History of Falls                                           Pertinent Vitals/Pain Pain Assessment: No/denies pain    Home Living Family/patient expects to be discharged to:: Private residence Living Arrangements: Spouse/significant other;Children Available Help at Discharge: Family;Available PRN/intermittently Type of Home: House Home Access: Stairs to enter Entrance Stairs-Rails: None Entrance Stairs-Number of Steps: 1 Home Layout: Two level Home Equipment: Cane - quad      Prior Function Level of Independence: Independent with assistive device(s)         Comments: Patient intermittently uses quad cane on R; limited in endurance but works in lab at [employer]        Extremity/Trunk Assessment   Upper Extremity Assessment Upper Extremity Assessment: Overall WFL for tasks assessed    Lower Extremity Assessment Lower Extremity Assessment: LLE deficits/detail LLE Deficits / Details: Decreased LT L2-3, L5-S1 on L; grossly 4- to 4/5 MMT, compared to 5/5 on R       Communication   Communication: No difficulties  Cognition Arousal/Alertness: Awake/alert Behavior During Therapy: WFL for tasks assessed/performed Overall Cognitive Status: Within Functional Limits for tasks assessed  General Comments      Exercises     Assessment/Plan    PT Assessment Patent does not need any further PT services  PT Problem List         PT Treatment Interventions      PT Goals (Current goals can be found in the Care Plan section)  Acute Rehab PT Goals Patient Stated Goal: "To get stronger" PT Goal Formulation: With patient/family Time For Goal Achievement: 11/07/16 Potential to Achieve Goals: Good    Frequency     Barriers to  discharge        Co-evaluation               AM-PAC PT "6 Clicks" Daily Activity  Outcome Measure Difficulty turning over in bed (including adjusting bedclothes, sheets and blankets)?: None Difficulty moving from lying on back to sitting on the side of the bed? : None Difficulty sitting down on and standing up from a chair with arms (e.g., wheelchair, bedside commode, etc,.)?: None Help needed moving to and from a bed to chair (including a wheelchair)?: None Help needed walking in hospital room?: None Help needed climbing 3-5 steps with a railing? : A Little 6 Click Score: 23    End of Session Equipment Utilized During Treatment: Gait belt Activity Tolerance: Patient tolerated treatment well Patient left: in bed;with call bell/phone within reach;with family/visitor present Nurse Communication: Mobility status PT Visit Diagnosis: Muscle weakness (generalized) (M62.81);History of falling (Z91.81)    Time: 2836-6294 PT Time Calculation (min) (ACUTE ONLY): 17 min   Charges:   PT Evaluation $PT Eval Low Complexity: 1 Procedure     PT G Codes:          Dorice Lamas, PT, DPT 10/24/2016, 11:23 AM

## 2016-10-24 NOTE — Care Management Note (Addendum)
Case Management Note  Patient Details  Name: [name] MRN: [id number] Date of Birth: [birth year]  Subjective/Objective:     A new order for home health PT was added. Discussed discharge planning with [name], discussed choices of home health providers.  [name] chose [facility]. A referral for HH-PT was called to Melene Muller at  [facility].              Action/Plan:   Expected Discharge Date:  10/24/16               Expected Discharge Plan:   10/24/16  In-House Referral:     Discharge planning Services     Post Acute Care Choice:   HH-PT Choice offered to:   Patient  DME Arranged:   None recommended by [facility]-PT DME Agency:   NA  HH Arranged:   HH-PT HH Agency:   [facility]  Status of Service:   Complete  If discussed at [place] of Stay Meetings, dates discussed:    Additional Comments:  Jaida Basurto A, RN 10/24/2016, 1:09 PM

## 2016-10-24 NOTE — Discharge Summary (Signed)
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 Nondiabetic gastroparesis 2008   idiopathic, resolved  . Optic neuritis   . Ovarian cyst    HOSPITAL COURSE:   The patient has better numbness and baseline weakness of left leg.  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  Per neurology consult, discontinued IV Solu-Medrol due to UTI.  If does not continue to improve by tomorrow. Restart solumderol and finish 3 day course.  PT evaluation suggested home PT. UTI (urinary tract infection)  She has been treated with IV Rocephin, no growth per urine culture. Change to Keflex for 3 more days.  DISCHARGE CONDITIONS:  Stable, discharge to home today with home PT. CONSULTS OBTAINED:  Treatment Team:  Leotis Pain, MD DRUG ALLERGIES:  No Known Allergies DISCHARGE MEDICATIONS:   Allergies as of 10/24/2016   No Known Allergies     Medication List    TAKE these medications   cephALEXin 500 MG capsule Commonly known as:  KEFLEX Take 1 capsule (500 mg total) by mouth 2 (two) times daily.   escitalopram 10 MG tablet Commonly known as:  LEXAPRO TAKE 1 TABLET BY MOUTH ONCE DAILY   ibuprofen 200 MG tablet Commonly known as:  ADVIL,MOTRIN 2 tablets daily as needed   methylphenidate 10 MG tablet Commonly known as:  RITALIN Take 10 mg by mouth daily.   RECLAST IV Inject into the vein.  Reported on 08/28/2015   riTUXimab in sodium chloride 0.9 % 250 mL Inject 1,000 mg into the vein once. Every 5 months   UNABLE TO FIND caltrate plus vit D chocolate truffle supplements daily, takes 2 daily        DISCHARGE INSTRUCTIONS:  See AVS.  If you experience worsening of your admission symptoms, develop shortness of breath, life threatening emergency, suicidal or homicidal thoughts you must seek medical attention immediately by calling 911 or calling your MD immediately  if symptoms less severe.  You Must read complete instructions/literature along with all the possible adverse reactions/side effects for all the Medicines you take and that have been prescribed to you. Take any new Medicines after you have completely understood and accpet all the possible adverse reactions/side effects.   Please note  You were cared for by a hospitalist during your hospital stay. If you have any questions about your discharge medications or the care you received while you were in the hospital after you are discharged, you can call the unit and asked to speak with the hospitalist on call if the hospitalist that took care of you is not available. Once you are discharged, your primary care physician will handle any further medical issues. Please note that NO REFILLS for any discharge medications will be authorized once you are discharged, as it is imperative that you return to your primary care physician (or establish a relationship with a primary care physician if you do not have one) for your aftercare needs so that  they can reassess your need for medications and monitor your lab values.    On the day of Discharge:  VITAL SIGNS:  Blood pressure 108/66, pulse 65, temperature 98 F (36.7 C), temperature source Oral, resp. rate 20, height 5\' 2"  (1.575 m), weight 100 lb (45.4 kg), last menstrual period 10/15/2016, SpO2 100 %. PHYSICAL EXAMINATION:  GENERAL:  36 y.o.-year-old patient lying in the bed  with no acute distress.  EYES: Pupils equal, round, reactive to light and accommodation. No scleral icterus. Extraocular muscles intact.  HEENT: Head atraumatic, normocephalic. Oropharynx and nasopharynx clear.  NECK:  Supple, no jugular venous distention. No thyroid enlargement, no tenderness.  LUNGS: Normal breath sounds bilaterally, no wheezing, rales,rhonchi or crepitation. No use of accessory muscles of respiration.  CARDIOVASCULAR: S1, S2 normal. No murmurs, rubs, or gallops.  ABDOMEN: Soft, non-tender, non-distended. Bowel sounds present. No organomegaly or mass.  EXTREMITIES: No pedal edema, cyanosis, or clubbing.  NEUROLOGIC: Cranial nerves II through XII are intact. Muscle strength 5/5 in all extremities except 4/5 on left leg. Sensation intact. Gait not checked.  PSYCHIATRIC: The patient is alert and oriented x 3.  SKIN: No obvious rash, lesion, or ulcer.  DATA REVIEW:   CBC  Recent Labs Lab 10/23/16 0527  WBC 5.6  HGB 13.1  HCT 39.1  PLT 152    Chemistries   Recent Labs Lab 10/22/16 2259 10/23/16 0527  NA 137 136  K 3.6 3.9  CL 101 104  CO2 28 25  GLUCOSE 89 160*  BUN 10 10  CREATININE 0.91 0.79  CALCIUM 9.1 8.9  AST 26  --   ALT 13*  --   ALKPHOS 77  --   BILITOT 0.8  --      Microbiology Results  Results for orders placed or performed during the hospital encounter of 10/22/16  Urine culture     Status: None   Collection Time: 10/22/16 11:08 PM  Result Value Ref Range Status   Specimen Description URINE, RANDOM  Final   Special Requests NONE  Final   Culture   Final    NO GROWTH Performed at [facility], [place] [address] [place],  [address]    Report Status 10/24/2016 FINAL  Final    RADIOLOGY:  No results found.   Management plans discussed with the patient, family and they are in agreement.  CODE STATUS: Full Code   TOTAL TIME TAKING CARE OF THIS PATIENT: 32 minutes.    Demetrios Loll M.D on 10/24/2016 at 12:14 PM  Between  7am to 6pm - Pager - [phone]  After 6pm go to [email or web address] - Proofreader  [facility]  Office  [phone]  CC: Primary care physician; Tonette Bihari, MD   Note: This dictation was prepared with Dragon dictation along with smaller phrase technology. Any transcriptional errors that result from this process are unintentional.

## 2016-10-26 ENCOUNTER — Other Ambulatory Visit: Payer: Self-pay | Admitting: *Deleted

## 2016-10-26 DIAGNOSIS — N390 Urinary tract infection, site not specified: Secondary | ICD-10-CM | POA: Diagnosis not present

## 2016-10-26 DIAGNOSIS — Z791 Long term (current) use of non-steroidal anti-inflammatories (NSAID): Secondary | ICD-10-CM | POA: Diagnosis not present

## 2016-10-26 DIAGNOSIS — G360 Neuromyelitis optica [Devic]: Secondary | ICD-10-CM | POA: Diagnosis not present

## 2016-10-26 NOTE — Patient Outreach (Signed)
[facility]) Care Management  10/26/2016  [name] Mar 09, 1981 [id number]   Subjective: Telephone call to patient's home  / mobile number, no answer, left HIPAA compliant voicemail message, and requested call back.   Objective: Per chart review, patient hospitalized 10/22/16 - 10/24/16 for UTI.     Assessment: Received UMR Transition of care referral on 10/26/16 via [employer] report.   Transition of care follow up pending patient contact.    Plan: RNCM will call patient for 2nd telephone outreach attempt, transition of care follow up, within 10 business days if no return call.    [name], BSN, [place] Management [facility] Telephonic CM Phone: [phone] Fax: [phone]

## 2016-10-28 ENCOUNTER — Other Ambulatory Visit: Payer: Self-pay | Admitting: *Deleted

## 2016-10-28 NOTE — Patient Outreach (Signed)
[facility]) Care Management  10/28/2016  [name] Jul 27, 1980 [id number]  Subjective: Telephone call to patient's home  / mobile number, no answer, left HIPAA compliant voicemail message, and requested call back.   Objective: Per chart review, patient hospitalized 10/22/16 - 10/24/16 for UTI.     Assessment: Received UMR Transition of care referral on 10/26/16 via [employer] report.   Transition of care follow up pending patient contact.    Plan: RNCM will call patient for 3rd telephone outreach attempt, transition of care follow up, within 10 business days if no return call.    Konstance Happel H. Annia Friendly, BSN, [place] Management [facility] Telephonic CM Phone: [phone] Fax: [phone]

## 2016-10-29 ENCOUNTER — Ambulatory Visit: Payer: Self-pay | Admitting: *Deleted

## 2016-11-01 ENCOUNTER — Encounter: Payer: Self-pay | Admitting: *Deleted

## 2016-11-01 ENCOUNTER — Other Ambulatory Visit: Payer: Self-pay | Admitting: *Deleted

## 2016-11-01 NOTE — Patient Outreach (Addendum)
[facility]) Care Management  11/01/2016  [name] [birth year] [id number]   Subjective:Telephone call to patient's home / mobile number, no answer, left HIPAA compliant voicemail message, and requested call back. Telephone call from patient,  spoke with patient, and HIPAA verified.  Discussed [facility] Care Management UMR Transition of care follow up, patient voiced understanding, and is in agreement to follow up.   Patient states she is doing well, has returned to work, and follow up appointments scheduled with specialist.  States she does not have the hospital indemnity supplemental insurance and will look into it in the future.  RNCM educated patient on family medical leave act [employer]) process, patient voices understanding, states she has had a lot of trouble obtaining FMLA in the past through [employer], her supervisor has encouraged her to apply, and states she will call [employer] to inititate the process.  Patient states she does not have any transition of care, care coordination, disease management, disease monitoring, transportation, community resource, or pharmacy needs at this time.  States she is very appreciative of the follow up, this RNCM has been very helpful,  and is in agreement to receive [place] Management information.    Objective: Per chart review, patient hospitalized 10/22/16 - 10/24/16 for UTI.    Assessment: Received UMR Transition of care referral on 10/26/16 via [employer] report. Transition of care follow up completed, no care management needs, and will proceed with case closure.    Plan:RNCM will send patient successful outreach letter, [facility] pamphlet, and magnet. RNCM will send case closure due to follow up completed / no care management needs request to [facility] [place] Management.     Chip Canepa H. Annia Friendly, BSN, [place] Management [facility] Telephonic CM Phone: [phone] Fax: [phone]

## 2016-11-10 ENCOUNTER — Telehealth: Payer: Self-pay | Admitting: Internal Medicine

## 2016-11-10 NOTE — Telephone Encounter (Addendum)
FMLA form left in my box. Please call patient to ask her why she needs FMLA? If this is for patient's neuromyelitis optica, she will need to send FMLA papers to neurologist that she is currently seeing for this issue

## 2016-11-11 NOTE — Telephone Encounter (Signed)
Spoke with patient, she states that copies of her FMLA paperwork were sent to all her providers for some reason. Neurologist is currently completing this for her.

## 2016-11-15 ENCOUNTER — Encounter: Payer: Self-pay | Admitting: Internal Medicine

## 2016-11-15 ENCOUNTER — Ambulatory Visit (INDEPENDENT_AMBULATORY_CARE_PROVIDER_SITE_OTHER): Payer: 59 | Admitting: Internal Medicine

## 2016-11-15 ENCOUNTER — Ambulatory Visit (INDEPENDENT_AMBULATORY_CARE_PROVIDER_SITE_OTHER): Payer: 59 | Admitting: Obstetrics & Gynecology

## 2016-11-15 ENCOUNTER — Encounter: Payer: Self-pay | Admitting: Obstetrics & Gynecology

## 2016-11-15 VITALS — BP 104/70 | HR 90 | Temp 98.2°F | Ht 62.0 in | Wt 102.0 lb

## 2016-11-15 VITALS — BP 102/64 | Ht 61.5 in | Wt 102.0 lb

## 2016-11-15 DIAGNOSIS — Z1151 Encounter for screening for human papillomavirus (HPV): Secondary | ICD-10-CM

## 2016-11-15 DIAGNOSIS — G935 Compression of brain: Secondary | ICD-10-CM

## 2016-11-15 DIAGNOSIS — F32A Depression, unspecified: Secondary | ICD-10-CM

## 2016-11-15 DIAGNOSIS — M8580 Other specified disorders of bone density and structure, unspecified site: Secondary | ICD-10-CM

## 2016-11-15 DIAGNOSIS — Z87898 Personal history of other specified conditions: Secondary | ICD-10-CM

## 2016-11-15 DIAGNOSIS — Z01411 Encounter for gynecological examination (general) (routine) with abnormal findings: Secondary | ICD-10-CM | POA: Diagnosis not present

## 2016-11-15 DIAGNOSIS — G360 Neuromyelitis optica [Devic]: Secondary | ICD-10-CM | POA: Diagnosis not present

## 2016-11-15 DIAGNOSIS — F329 Major depressive disorder, single episode, unspecified: Secondary | ICD-10-CM | POA: Diagnosis not present

## 2016-11-15 DIAGNOSIS — R300 Dysuria: Secondary | ICD-10-CM

## 2016-11-15 DIAGNOSIS — N390 Urinary tract infection, site not specified: Secondary | ICD-10-CM | POA: Diagnosis not present

## 2016-11-15 DIAGNOSIS — L292 Pruritus vulvae: Secondary | ICD-10-CM

## 2016-11-15 DIAGNOSIS — Z9851 Tubal ligation status: Secondary | ICD-10-CM | POA: Diagnosis not present

## 2016-11-15 LAB — WET PREP FOR TRICH, YEAST, CLUE
CLUE CELLS WET PREP: NONE SEEN
Trich, Wet Prep: NONE SEEN
Yeast Wet Prep HPF POC: NONE SEEN

## 2016-11-15 MED ORDER — ESCITALOPRAM OXALATE 10 MG PO TABS: 10.0000 mg | ORAL_TABLET | Freq: Every day | ORAL | 2 refills | Status: DC

## 2016-11-15 NOTE — Patient Instructions (Signed)
1. Encounter for gynecological examination with abnormal finding Normal Gyn exam except for mild Vulvitis.  Pap/HPV HR done.    2. Vulvar itching Wet prep negative for Yeasts.  Irritative Vulvitis?  Will try Hydrocortisone 1% cream.  If no improvement, try Monistat OTC.  3. Tubal ligation status   4. History of dysuria Recurrent/Chronic Dysuria/Difficulty passing urine with Negative Urine Cultures.  Refer to Urologist for investigation, r/o Interstitial Cystitis.  Da, it was a pleasure to see you today and meet your daughter!  I will inform you of your results as soon as available.

## 2016-11-15 NOTE — Progress Notes (Signed)
   [facility] [place] Mo, MD Phone: [phone]  Reason For Visit: Hospital F/U  # Neuromyelitis Optica  - Flare ups include numbness in the lower half of her body, eye pain, blurred vision, weakness and fatigue - Hospital stay 3 days ago, last flare up was associated with UTI, patient was treated with ceftriaxone and keflex, completed the full course  - Neurology follow up this month at [facility] - June 21st  - Receives infusion of [place] [facility] at [facility] on June 21 st  - previously had long term steroid use which resulted in osteopenia; patient had to be on Reclast to help build back bones, managed by [facility]. Patient also recieves Ritalin at times as NMO sometimes results  UTI  Continues to have pain with urination, negative urine culture from hospital stay, recently has become worse. Patient was seen by OBGYN who obtained UA and urine culture. Has a referral to urology. No fevers, chills, no CVA tenderness. Completed course of antibotics ceftriaxone +   Depression  - Well controlled, use to get Lexapro from [facility] neurology. Would like to continue to receive Lexapro from [place] - Has been well-controlled on Lexapro for over 1 year  - No issues with sadness or SI  - PHQ-9 9 , associated symptoms with fatigue per patient are due to NMO, not associated with mood. Overall she states she is feeling up about things Past Medical History Reviewed problem list.  Medications- reviewed and updated No additions to family history Social history- patient is a non smoker  Objective: BP 104/70   Pulse 90   Temp 98.2 F (36.8 C) (Oral)   Ht 5\' 2"  (1.575 m)   Wt 102 lb (46.3 kg)   LMP 11/06/2016   SpO2 96%   BMI 18.66 kg/m  Gen: NAD, alert, cooperative with exam, very young looking female, using a cane  Cardio: regular rate and rhythm, S1S2 heard, no murmurs appreciated Pulm: clear to auscultation bilaterally, no wheezes,  rhonchi or rales GI: soft, non-tender, non-distended, bowel sounds present, no hepatomegaly, no splenomegaly Skin: dry, intact, no rashes or lesions    Assessment/Plan: See problem based a/p  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 Concern intertistial cysitis vs. Neurogenic effects. No fevers or chills. No CVA tenderness. 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

## 2016-11-15 NOTE — Patient Instructions (Addendum)
It was very nice to meet you. I have no issues with refilling your Lexapro. Please follow up every 6 months or so.

## 2016-11-15 NOTE — Progress Notes (Signed)
[name] [birth year] [id number]   History:    36 y.o. G2P2 married.  S/P BT/S.  Girls 17 and 36 yo.  RP:  Established patient presenting for annual gyn exam  HPI:  Menses reg normal qmonth, light flow.  No pelvic pain.  Frequent irritative bladder/urethral Sxs, similar to a bladder infection with dysuria and difficulty passing urine, but U. Culture negative.  No Urology investigation so far.  C/O vulvar itching x a few days, post ABTx for suspected Cystitis for which the Culture was negative again.  Breasts wnl.  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  Reclast for Medication induced Osteopenia.  Past medical history,surgical history, family history and social history were all reviewed and documented in the EPIC chart.  Gynecologic History Patient's last menstrual period was 11/06/2016. Contraception: tubal ligation Last Pap: normal 2017 Last mammogram: None Bone Density 2016  Obstetric History OB History  Gravida Para Term Preterm AB Living  2 2       2   SAB TAB Ectopic Multiple Live Births               # Outcome Date GA Lbr Len/2nd Weight Sex Delivery Anes PTL Lv  2 Para           1 Para                ROS: A ROS was performed and pertinent positives and negatives are included in the history.  GENERAL: No fevers or chills. HEENT: No change in vision, no earache, sore throat or sinus congestion. NECK: No pain or stiffness. CARDIOVASCULAR: No chest pain or pressure. No palpitations. PULMONARY: No shortness of breath, cough or wheeze. GASTROINTESTINAL: No abdominal pain, nausea, vomiting or diarrhea, melena or bright red blood per rectum. GENITOURINARY: No urinary frequency, urgency, hesitancy or dysuria. MUSCULOSKELETAL: No joint or muscle pain, no back pain, no recent trauma. DERMATOLOGIC: No rash, no itching, no lesions. ENDOCRINE: No polyuria, polydipsia, no heat or cold intolerance. No recent change in weight. HEMATOLOGICAL: No anemia or easy bruising or bleeding.  NEUROLOGIC: No headache, seizures, numbness, tingling or weakness. PSYCHIATRIC: No depression, no loss of interest in normal activity or change in sleep pattern.     Exam:   BP 102/64   Ht 5' 1.5" (1.562 m)   Wt 102 lb (46.3 kg)   LMP 11/06/2016   BMI 18.96 kg/m   Body mass index is 18.96 kg/m.  General appearance : Well developed well nourished female. No acute distress HEENT: Eyes: no retinal hemorrhage or exudates,  Neck supple, trachea midline, no carotid bruits, no thyroidmegaly Lungs: Clear to auscultation, no rhonchi or wheezes, or rib retractions  Heart: Regular rate and rhythm, no murmurs or gallops Breast:Examined in sitting and supine position were symmetrical in appearance, no palpable masses or tenderness,  no skin retraction, no nipple inversion, no nipple discharge, no skin discoloration, no axillary or supraclavicular lymphadenopathy Abdomen: no palpable masses or tenderness, no rebound or guarding Extremities: no edema or skin discoloration or tenderness  Pelvic: Vulva with mild erythema/irritation  Bartholin, Urethra, Skene Glands: Within normal limits             Vagina: No gross lesions or discharge.  Wet prep done.  Cervix: No gross lesions or discharge.  Pap/HPV done.  Uterus  AV, normal size, shape and consistency, non-tender and mobile  Adnexa  Without masses or tenderness  Anus and perineum  normal    Assessment/Plan:  36 y.o. female for annual exam  1. Encounter for gynecological examination with abnormal finding Normal Gyn exam except for mild Vulvitis.  Pap/HPV HR done.    2. Vulvar itching Wet prep negative for Yeasts.  Irritative Vulvitis?  Will try Hydrocortisone 1% cream.  If no improvement, try Monistat OTC.  3. Tubal ligation status   4. History of dysuria Recurrent/Chronic Dysuria/Difficulty passing urine with Negative Urine Cultures.  Refer to Urologist for investigation, r/o Interstitial Cystitis.  Counseling on above issues >50% x  10 minutes.  Princess Bruins MD, 12:00 PM 11/15/2016

## 2016-11-16 ENCOUNTER — Telehealth: Payer: Self-pay | Admitting: *Deleted

## 2016-11-16 NOTE — Assessment & Plan Note (Signed)
Follows with [facility]  - Currently on Rituximab  - Previously receiving long term steroids which caused osteopenia, now on Reclast flowed by [facility] - Also receive Ritalin to help with fatigue associated with NMO - [facility] neurology

## 2016-11-16 NOTE — Assessment & Plan Note (Signed)
Well controlled PQ9: 2 if you exclude symptoms of fatigue per patient that are specifically associated with NMO  - Will refill Lexapro, follow up in 3 months

## 2016-11-16 NOTE — Assessment & Plan Note (Signed)
Never associated with symptoms

## 2016-11-16 NOTE — Telephone Encounter (Signed)
-----   Message from Princess Bruins, MD sent at 11/15/2016 12:17 PM EDT ----- Regarding: Refer to Urology Refer to Urology.  Investigate possible Interstitial Cystitis?  Recurrent irritative symptoms with negative U. Culture.

## 2016-11-16 NOTE — Assessment & Plan Note (Signed)
Recently admitted and treated for possibly UTI, thought symptoms of dysuria continue after discharge. Concern intertistial cysitis vs. Neurogenic effects. No fevers or chills. No CVA tenderness. Unlikely infectious  - Urine culture and UA and urology referral obtained a GYN earlier to day so patient declined

## 2016-11-16 NOTE — Telephone Encounter (Signed)
Referral faxed they will fax me back with time and date to relay to patient.  

## 2016-11-17 LAB — PAP, TP IMAGING W/ HPV RNA, RFLX HPV TYPE 16,18/45: HPV mRNA, High Risk: NOT DETECTED

## 2016-11-18 DIAGNOSIS — Z7409 Other reduced mobility: Secondary | ICD-10-CM | POA: Diagnosis not present

## 2016-11-18 DIAGNOSIS — G360 Neuromyelitis optica [Devic]: Secondary | ICD-10-CM | POA: Diagnosis not present

## 2016-11-18 DIAGNOSIS — R296 Repeated falls: Secondary | ICD-10-CM | POA: Diagnosis not present

## 2016-11-30 NOTE — Telephone Encounter (Signed)
Pt scheduled on 01/14/17 @ 12:30am with Dr.Budzyn left message for pt to call.

## 2016-12-06 DIAGNOSIS — Z7409 Other reduced mobility: Secondary | ICD-10-CM | POA: Diagnosis not present

## 2016-12-06 DIAGNOSIS — G360 Neuromyelitis optica [Devic]: Secondary | ICD-10-CM | POA: Diagnosis not present

## 2016-12-06 DIAGNOSIS — R296 Repeated falls: Secondary | ICD-10-CM | POA: Diagnosis not present

## 2016-12-07 DIAGNOSIS — G360 Neuromyelitis optica [Devic]: Secondary | ICD-10-CM | POA: Diagnosis not present

## 2016-12-10 ENCOUNTER — Encounter: Payer: Self-pay | Admitting: *Deleted

## 2016-12-10 NOTE — Telephone Encounter (Signed)
Sent pt mychart message as well.

## 2017-01-12 DIAGNOSIS — G360 Neuromyelitis optica [Devic]: Secondary | ICD-10-CM | POA: Diagnosis not present

## 2017-01-26 DIAGNOSIS — G360 Neuromyelitis optica [Devic]: Secondary | ICD-10-CM | POA: Diagnosis not present

## 2017-03-09 ENCOUNTER — Ambulatory Visit (INDEPENDENT_AMBULATORY_CARE_PROVIDER_SITE_OTHER): Payer: 59 | Admitting: Family Medicine

## 2017-03-09 ENCOUNTER — Encounter: Payer: Self-pay | Admitting: Family Medicine

## 2017-03-09 VITALS — BP 108/70 | HR 59 | Temp 98.3°F | Wt 101.0 lb

## 2017-03-09 DIAGNOSIS — H918X9 Other specified hearing loss, unspecified ear: Secondary | ICD-10-CM | POA: Diagnosis not present

## 2017-03-09 DIAGNOSIS — H938X3 Other specified disorders of ear, bilateral: Secondary | ICD-10-CM | POA: Diagnosis not present

## 2017-03-09 NOTE — Progress Notes (Signed)
Subjective:   Patient ID: [name]    DOB: [birth year], 36 y.o. female   MRN: [id number]  CC: bilateral hearing and pain R>L, with itching and fullness x 1 year   HPI: [name] is a 36 y.o. female who presents to clinic today for bilateral ear fullness.  Hearing loss, bilateral Pt reports this is not a new problem and has been ongoing since this past summer.  Has not had it evaluated but recently is worse which brings her in today.  She finds herself having to read lips a lot more than normal.   -Has a history of recurrent ear infections which began around June 2017 after she went swimming in the ocean.  Has been treated with antibiotics and ear drops 1 year ago but did not get better with this.  Has had sensation of fullness in the ears bilaterally but right ear moreso than left.   -Describes pain as 5/10 and it comes and goes, usually worse in the morning -Reports that when she tilts head to the right it makes a "glug glug" sound as if there was water in the ear canal  -Reports severe itchiness associated with this and she has been relieving it by scratching with her fingers and cutips without the cotton on the tip  -Denies fevers, chills, nausea, vomiting, diarrhea.  Denies dizziness, headache, congestion, runny nose.  No other associated symptoms.   -She has however noticed that there is a "chunky white discharge" that she retrieves from the ear.  Has not noticed any purulent drainage or blood.  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  She follows with [facility] for this condition.   -No history of trauma to the ear.  Does not listen to loud music.  She works at [employer] as a Quarry manager.   ROS: Denies fevers, chills, nausea, vomiting, diarrhea.  Denies headache, congestion, runny nose.  [place]: Pertinent  past medical, surgical, family, and social history were reviewed and updated as appropriate. Smoking status reviewed. Medications reviewed.  Objective:   BP 108/70   Pulse (!) 59   Temp 98.3 F (36.8 C) (Oral)   Wt 101 lb (45.8 kg)   LMP 02/16/2017   SpO2 99%   BMI 18.47 kg/m  Vitals and nursing note reviewed.  General: 36 yo female, NAD  HEENT: NCAT, TM's visualized and clear bilaterally without bulging, erythema purulent drainage noted, moderate amount of white thickened discharge present in R ear canal, easily removed with gentle scraping,  MMM, o/p clear  Neck: supple, non-tender without LAD  CV: RRR no MRG  Lungs: CTAB, work of breathing is comfortable  Abdomen: soft, NTBD, +bs  Skin: warm, dry, no rashes  Extremities: warm and well perfused, normal tone   Assessment & Plan:   Ear fullness, bilateral Sensation of bilateral ear fullness (R>L) ongoing since this past summer.  Inciting event appears to be from swimming in the ocean.  Denies history of trauma or injury to the ear.  Difficulty with hearing voices and has been reading lips more.   No red flags on exam or associated symptoms concerning for an infection at this time.  Bilateral ear exam revealed normal appearing TMs with no erythema drainage or bulging .  Small to moderate amount of white thick substance in right  ear canal found.  On exam she was able to localize low volume sounds and hear voice at a whisper level.   -Wonder if symptoms of hearing loss could be related to NMO as R is worse than L and her optic neuritis symptoms are localized to that side.  -Follows with [facility] for NMO, on Rituximab - may be more suspectible to infections as she is immunosuppressed -No antibiotics prescribed today as I do not suspect infectious etiology at this time; however will refer to ENT for audiology eval and further testing   Orders Placed This Encounter  Procedures  . Ambulatory referral to ENT    Referral Priority:   Routine     Referral Type:   Consultation    Referral Reason:   Specialty Services Required    Requested Specialty:   Otolaryngology    Number of Visits Requested:   1    Follow up: 6 weeks   Lovenia Kim, MD [place], PGY-2 03/10/2017 11:44 AM

## 2017-03-09 NOTE — Patient Instructions (Addendum)
It was nice meeting you! You were seen in clinic for hearing loss and pain.  Upon examination of your ears today and from what you told me, I do not suspect an infection at this time and therefore did not prescribe you antibiotics.  I would however like for you to see an ENT doctor for further evaluation and hearing tests to see if this is nerve related to your NMO.  You can expect a call within the next week to schedule an appointment.   In the meantime, please refrain from using qtips or any other sharp objects to clean the ear.  As we discussed, this can put you at risk for a rupture of the eardrum.     You can call clinic with any questions.    Be well,  Lovenia Kim, MD

## 2017-03-10 DIAGNOSIS — H938X3 Other specified disorders of ear, bilateral: Secondary | ICD-10-CM | POA: Insufficient documentation
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
## 2017-03-30 DIAGNOSIS — H9313 Tinnitus, bilateral: Secondary | ICD-10-CM | POA: Diagnosis not present

## 2017-03-30 DIAGNOSIS — G35 Multiple sclerosis: Secondary | ICD-10-CM | POA: Diagnosis not present

## 2017-03-30 DIAGNOSIS — H468 Other optic neuritis: Secondary | ICD-10-CM | POA: Diagnosis not present

## 2017-03-30 DIAGNOSIS — H93293 Other abnormal auditory perceptions, bilateral: Secondary | ICD-10-CM | POA: Diagnosis not present

## 2017-03-30 DIAGNOSIS — Z822 Family history of deafness and hearing loss: Secondary | ICD-10-CM | POA: Diagnosis not present

## 2017-03-30 DIAGNOSIS — H60393 Other infective otitis externa, bilateral: Secondary | ICD-10-CM | POA: Diagnosis not present

## 2017-03-30 DIAGNOSIS — Z7289 Other problems related to lifestyle: Secondary | ICD-10-CM | POA: Diagnosis not present

## 2017-04-04 DIAGNOSIS — H60393 Other infective otitis externa, bilateral: Secondary | ICD-10-CM | POA: Diagnosis not present

## 2017-04-04 DIAGNOSIS — Z7289 Other problems related to lifestyle: Secondary | ICD-10-CM | POA: Diagnosis not present

## 2017-04-14 DIAGNOSIS — G360 Neuromyelitis optica [Devic]: Secondary | ICD-10-CM | POA: Diagnosis not present

## 2017-04-19 ENCOUNTER — Other Ambulatory Visit: Payer: Self-pay | Admitting: Internal Medicine

## 2017-04-19 DIAGNOSIS — F32A Depression, unspecified: Secondary | ICD-10-CM

## 2017-04-19 DIAGNOSIS — F329 Major depressive disorder, single episode, unspecified: Secondary | ICD-10-CM

## 2017-04-27 DIAGNOSIS — H60313 Diffuse otitis externa, bilateral: Secondary | ICD-10-CM | POA: Diagnosis not present

## 2017-04-27 DIAGNOSIS — Z0110 Encounter for examination of ears and hearing without abnormal findings: Secondary | ICD-10-CM | POA: Diagnosis not present

## 2017-06-15 DIAGNOSIS — G360 Neuromyelitis optica [Devic]: Secondary | ICD-10-CM | POA: Diagnosis not present

## 2017-06-24 DIAGNOSIS — G360 Neuromyelitis optica [Devic]: Secondary | ICD-10-CM | POA: Diagnosis not present

## 2017-06-24 DIAGNOSIS — R296 Repeated falls: Secondary | ICD-10-CM | POA: Diagnosis not present

## 2017-06-24 DIAGNOSIS — R269 Unspecified abnormalities of gait and mobility: Secondary | ICD-10-CM | POA: Diagnosis not present

## 2017-06-28 DIAGNOSIS — G360 Neuromyelitis optica [Devic]: Secondary | ICD-10-CM | POA: Diagnosis not present

## 2017-09-06 ENCOUNTER — Other Ambulatory Visit: Payer: Self-pay | Admitting: Internal Medicine

## 2017-09-06 DIAGNOSIS — F329 Major depressive disorder, single episode, unspecified: Secondary | ICD-10-CM

## 2017-09-06 DIAGNOSIS — F32A Depression, unspecified: Secondary | ICD-10-CM

## 2017-11-05 DIAGNOSIS — G360 Neuromyelitis optica [Devic]: Secondary | ICD-10-CM | POA: Diagnosis not present

## 2017-11-05 DIAGNOSIS — G935 Compression of brain: Secondary | ICD-10-CM | POA: Diagnosis not present

## 2017-11-15 DIAGNOSIS — G360 Neuromyelitis optica [Devic]: Secondary | ICD-10-CM | POA: Diagnosis not present

## 2017-11-24 DIAGNOSIS — R252 Cramp and spasm: Secondary | ICD-10-CM | POA: Diagnosis not present

## 2017-11-24 DIAGNOSIS — Z79899 Other long term (current) drug therapy: Secondary | ICD-10-CM | POA: Diagnosis not present

## 2017-11-24 DIAGNOSIS — G360 Neuromyelitis optica [Devic]: Secondary | ICD-10-CM | POA: Diagnosis not present

## 2017-11-24 DIAGNOSIS — R5383 Other fatigue: Secondary | ICD-10-CM | POA: Diagnosis not present

## 2017-11-29 DIAGNOSIS — G360 Neuromyelitis optica [Devic]: Secondary | ICD-10-CM | POA: Diagnosis not present

## 2018-02-03 DIAGNOSIS — Z0100 Encounter for examination of eyes and vision without abnormal findings: Secondary | ICD-10-CM | POA: Diagnosis not present

## 2018-03-31 ENCOUNTER — Other Ambulatory Visit: Payer: Self-pay | Admitting: Internal Medicine

## 2018-03-31 DIAGNOSIS — F329 Major depressive disorder, single episode, unspecified: Secondary | ICD-10-CM

## 2018-03-31 DIAGNOSIS — F32A Depression, unspecified: Secondary | ICD-10-CM

## 2018-04-18 DIAGNOSIS — R252 Cramp and spasm: Secondary | ICD-10-CM | POA: Diagnosis not present

## 2018-04-18 DIAGNOSIS — G360 Neuromyelitis optica [Devic]: Secondary | ICD-10-CM | POA: Diagnosis not present

## 2018-04-18 DIAGNOSIS — R5383 Other fatigue: Secondary | ICD-10-CM | POA: Diagnosis not present

## 2018-05-16 DIAGNOSIS — G360 Neuromyelitis optica [Devic]: Secondary | ICD-10-CM | POA: Diagnosis not present

## 2018-06-01 DIAGNOSIS — G360 Neuromyelitis optica [Devic]: Secondary | ICD-10-CM | POA: Diagnosis not present

## 2018-06-06 ENCOUNTER — Ambulatory Visit (INDEPENDENT_AMBULATORY_CARE_PROVIDER_SITE_OTHER): Payer: 59 | Admitting: Family Medicine

## 2018-06-06 ENCOUNTER — Other Ambulatory Visit: Payer: Self-pay

## 2018-06-06 DIAGNOSIS — F32A Depression, unspecified: Secondary | ICD-10-CM

## 2018-06-06 DIAGNOSIS — L98411 Non-pressure chronic ulcer of buttock limited to breakdown of skin: Secondary | ICD-10-CM | POA: Diagnosis not present

## 2018-06-06 DIAGNOSIS — F329 Major depressive disorder, single episode, unspecified: Secondary | ICD-10-CM | POA: Diagnosis not present

## 2018-06-06 MED ORDER — ZINC OXIDE 40 % EX OINT: 1.0000 "application " | TOPICAL_OINTMENT | CUTANEOUS | 0 refills | Status: DC | PRN

## 2018-06-06 MED ORDER — ESCITALOPRAM OXALATE 10 MG PO TABS: 10.0000 mg | ORAL_TABLET | Freq: Every day | ORAL | 1 refills | Status: DC
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
## 2018-06-06 NOTE — Assessment & Plan Note (Signed)
Small 1/2 cm region of skin breakdown near anal opening without any signs of infection. Likely irritation via contact.  Rectal exam without any evidence of anal fissures or external hemorrhoids.  - Start using Desitin ointment daily and after wiping - Keep area clean - Follow-up if area is not improving or worsens, especially in the setting of poor wound healing with her immunosuppression

## 2018-06-06 NOTE — Assessment & Plan Note (Signed)
Well-controlled on Lexapro 10 mg. Considered doing a trial off of the medication, however given current stressors in patient's life, will hold off on this currently. -Continue Lexapro -Consider trial off in future - Patient to reach out to employee therapy if needed or follow-up if depression becomes uncontrolled

## 2018-06-06 NOTE — Patient Instructions (Signed)
It was a wonderful meeting you today!  Apply the ointment to the region daily, and after region has been wiped off.  Please let me know if this area does not improve or worsens.  Good luck with school and I hope you have a wonderful rest of your vacation!

## 2018-08-29 ENCOUNTER — Other Ambulatory Visit: Payer: Self-pay | Admitting: Internal Medicine

## 2018-09-28 ENCOUNTER — Telehealth: Payer: Self-pay

## 2018-09-28 ENCOUNTER — Encounter: Payer: Self-pay | Admitting: Family Medicine

## 2018-09-28 NOTE — Telephone Encounter (Signed)
Advised pt to drop of form at the front desk, to be completed by physician. Pt understood. Salvatore Marvel, CMA

## 2018-10-05 DIAGNOSIS — F411 Generalized anxiety disorder: Secondary | ICD-10-CM | POA: Diagnosis not present

## 2018-10-06 NOTE — Telephone Encounter (Signed)
Attempted to call patient to inquire about nursing form.  There was no answer and the voice mail box is full.  [name], [place]

## 2018-10-06 NOTE — Telephone Encounter (Signed)
I have not received any form in my box from this patient, does she still need this completed?

## 2018-10-11 DIAGNOSIS — F411 Generalized anxiety disorder: Secondary | ICD-10-CM | POA: Diagnosis not present

## 2018-10-18 DIAGNOSIS — F411 Generalized anxiety disorder: Secondary | ICD-10-CM | POA: Diagnosis not present

## 2018-10-24 ENCOUNTER — Telehealth: Payer: Self-pay | Admitting: Family Medicine

## 2018-10-24 NOTE — Telephone Encounter (Signed)
[employer] form dropped off for at front desk for completion.  Verified that patient section of form has been completed.  Last DOS/WCC with PCP was 06/06/18.  Placed form in team folder to be completed by clinical staff.  Crista Luria

## 2018-10-25 DIAGNOSIS — F411 Generalized anxiety disorder: Secondary | ICD-10-CM | POA: Diagnosis not present

## 2018-10-25 NOTE — Telephone Encounter (Signed)
Reviewed form and placed in PCP's box for completion.  .Michelle R Simpson, CMA  

## 2018-10-27 NOTE — Telephone Encounter (Signed)
LVM informing her form is ready for pick up.

## 2018-10-27 NOTE — Telephone Encounter (Signed)
Form completed and placed in RN box. Thank you!   Patriciaann Clan, DO

## 2018-10-31 DIAGNOSIS — Z79899 Other long term (current) drug therapy: Secondary | ICD-10-CM | POA: Diagnosis not present

## 2018-10-31 DIAGNOSIS — G360 Neuromyelitis optica [Devic]: Secondary | ICD-10-CM | POA: Diagnosis not present

## 2018-11-02 DIAGNOSIS — F411 Generalized anxiety disorder: Secondary | ICD-10-CM | POA: Diagnosis not present

## 2018-11-07 DIAGNOSIS — F411 Generalized anxiety disorder: Secondary | ICD-10-CM | POA: Diagnosis not present

## 2018-11-14 DIAGNOSIS — G360 Neuromyelitis optica [Devic]: Secondary | ICD-10-CM | POA: Diagnosis not present

## 2018-11-14 DIAGNOSIS — Z79899 Other long term (current) drug therapy: Secondary | ICD-10-CM | POA: Diagnosis not present

## 2018-11-16 DIAGNOSIS — F411 Generalized anxiety disorder: Secondary | ICD-10-CM | POA: Diagnosis not present

## 2018-11-23 DIAGNOSIS — F411 Generalized anxiety disorder: Secondary | ICD-10-CM | POA: Diagnosis not present

## 2018-11-30 DIAGNOSIS — F411 Generalized anxiety disorder: Secondary | ICD-10-CM | POA: Diagnosis not present

## 2018-12-07 DIAGNOSIS — F411 Generalized anxiety disorder: Secondary | ICD-10-CM | POA: Diagnosis not present

## 2018-12-14 DIAGNOSIS — F411 Generalized anxiety disorder: Secondary | ICD-10-CM | POA: Diagnosis not present

## 2018-12-28 DIAGNOSIS — F411 Generalized anxiety disorder: Secondary | ICD-10-CM | POA: Diagnosis not present

## 2019-01-24 DIAGNOSIS — F411 Generalized anxiety disorder: Secondary | ICD-10-CM | POA: Diagnosis not present

## 2019-01-31 ENCOUNTER — Telehealth: Payer: Self-pay

## 2019-01-31 ENCOUNTER — Other Ambulatory Visit: Payer: Self-pay | Admitting: Family Medicine

## 2019-01-31 DIAGNOSIS — Z111 Encounter for screening for respiratory tuberculosis: Secondary | ICD-10-CM

## 2019-01-31 NOTE — Telephone Encounter (Signed)
Informed patient.  .Kieli Golladay R Baillie Mohammad, CMA  

## 2019-01-31 NOTE — Telephone Encounter (Signed)
Future order placed for quant gold, please let patient know. Thank you!  Patriciaann Clan, DO

## 2019-01-31 NOTE — Telephone Encounter (Signed)
Patient calls nurse line stating she needs a Percival Spanish drawn for school. Patient works for [employer] and have can test drawn there, just needs the order placed in epic. Please advise.

## 2019-02-01 ENCOUNTER — Other Ambulatory Visit
Admission: RE | Admit: 2019-02-01 | Discharge: 2019-02-01 | Disposition: A | Discharge status: Home / Self Care | Payer: 59 | Attending: Family Medicine | Admitting: Family Medicine

## 2019-02-01 DIAGNOSIS — Z111 Encounter for screening for respiratory tuberculosis: Secondary | ICD-10-CM | POA: Insufficient documentation

## 2019-02-04 LAB — QUANTIFERON-TB GOLD PLUS (RQFGPL)
QuantiFERON Mitogen Value: 6.69 IU/mL
QuantiFERON Nil Value: 0.02 IU/mL
QuantiFERON TB1 Ag Value: 0.02 IU/mL
QuantiFERON TB2 Ag Value: 0.02 IU/mL

## 2019-02-04 LAB — QUANTIFERON-TB GOLD PLUS: QuantiFERON-TB Gold Plus: NEGATIVE

## 2019-03-06 DIAGNOSIS — Z0100 Encounter for examination of eyes and vision without abnormal findings: Secondary | ICD-10-CM | POA: Diagnosis not present

## 2019-03-26 ENCOUNTER — Telehealth: Payer: 59 | Admitting: Physician Assistant

## 2019-03-26 DIAGNOSIS — Z20828 Contact with and (suspected) exposure to other viral communicable diseases: Secondary | ICD-10-CM

## 2019-03-26 DIAGNOSIS — Z20822 Contact with and (suspected) exposure to covid-19: Secondary | ICD-10-CM

## 2019-03-26 DIAGNOSIS — R059 Cough, unspecified: Secondary | ICD-10-CM

## 2019-03-26 DIAGNOSIS — R05 Cough: Secondary | ICD-10-CM

## 2019-03-26 MED ORDER — BENZONATATE 100 MG PO CAPS: 100.0000 mg | ORAL_CAPSULE | Freq: Three times a day (TID) | ORAL | 0 refills | Status: DC | PRN

## 2019-03-26 NOTE — Progress Notes (Signed)
E-Visit for Corona Virus Screening   Your current symptoms could be consistent with the coronavirus.  Many health care providers can now test patients at their office but not all are.  [facility] has multiple testing sites. For information on our COVID testing locations and hours go to [email or web address]  Please quarantine yourself while awaiting your test results.  We are enrolling you in our [place] for [place] . Daily you will receive a questionnaire within the [place] website. Our COVID 19 response team willl be monitoriing your responses daily.    COVID-19 is a respiratory illness with symptoms that are similar to the flu. Symptoms are typically mild to moderate, but there have been cases of severe illness and death due to the virus. The following symptoms may appear 2-14 days after exposure: . Fever . Cough . Shortness of breath or difficulty breathing . Chills . Repeated shaking with chills . Muscle pain . Headache . Sore throat . New loss of taste or smell . Fatigue . Congestion or runny nose . Nausea or vomiting . Diarrhea  It is vitally important that if you feel that you have an infection such as this virus or any other virus that you stay home and away from places where you may spread it to others.  You should self-quarantine for 14 days if you have symptoms that could potentially be coronavirus or have been in close contact a with a person diagnosed with COVID-19 within the last 2 weeks. You should avoid contact with people age 69 and older.   You should wear a mask or cloth face covering over your nose and mouth if you must be around other people or animals, including pets (even at home). Try to stay at least 6 feet away from other people. This will protect the people around you.  You can use medication such as A prescription cough medication called Tessalon Perles 100 mg. You may take 1-2 capsules every 8 hours as needed for  cough  You may also take acetaminophen (Tylenol) as needed for fever.   Reduce your risk of any infection by using the same precautions used for avoiding the common cold or flu:  Marland Kitchen Wash your hands often with soap and warm water for at least 20 seconds.  If soap and water are not readily available, use an alcohol-based hand sanitizer with at least 60% alcohol.  . If coughing or sneezing, cover your mouth and nose by coughing or sneezing into the elbow areas of your shirt or coat, into a tissue or into your sleeve (not your hands). . Avoid shaking hands with others and consider head nods or verbal greetings only. . Avoid touching your eyes, nose, or mouth with unwashed hands.  . Avoid close contact with people who are sick. . Avoid places or events with large numbers of people in one location, like concerts or sporting events. . Carefully consider travel plans you have or are making. . If you are planning any travel outside or inside the [place], visit the CDC's Travelers' Health webpage for the latest health notices. . If you have some symptoms but not all symptoms, continue to monitor at home and seek medical attention if your symptoms worsen. . If you are having a medical emergency, call 911.  HOME CARE . Only take medications as instructed by your medical team. . Drink plenty of fluids and get plenty of rest. . A steam or ultrasonic humidifier can help if you have congestion.  GET HELP RIGHT AWAY IF YOU HAVE EMERGENCY WARNING SIGNS** FOR COVID-19. If you or someone is showing any of these signs seek emergency medical care immediately. Call 911 or proceed to your closest emergency facility if: . You develop worsening high fever. . Trouble breathing . Bluish lips or face . Persistent pain or pressure in the chest . New confusion . Inability to wake or stay awake . You cough up blood. . Your symptoms become more severe  **This list is not all possible symptoms. Contact your medical  provider for any symptoms that are sever or concerning to you.   MAKE SURE YOU   Understand these instructions.  Will watch your condition.  Will get help right away if you are not doing well or get worse.  Your e-visit answers were reviewed by a board certified advanced clinical practitioner to complete your personal care plan.  Depending on the condition, your plan could have included both over the counter or prescription medications.  If there is a problem please reply once you have received a response from your provider.  Your safety is important to [place].  If you have drug allergies check your prescription carefully.    You can use MyChart to ask questions about today's visit, request a non-urgent call back, or ask for a work or school excuse for 24 hours related to this e-Visit. If it has been greater than 24 hours you will need to follow up with your provider, or enter a new e-Visit to address those concerns. You will get an e-mail in the next two days asking about your experience.  I hope that your e-visit has been valuable and will speed your recovery. Thank you for using e-visits.   Greater than 5 minutes, yet less than 10 minutes of time have been spent researching, coordinating and implementing care for this patient today.

## 2019-05-08 DIAGNOSIS — G360 Neuromyelitis optica [Devic]: Secondary | ICD-10-CM | POA: Diagnosis not present

## 2019-05-10 DIAGNOSIS — F411 Generalized anxiety disorder: Secondary | ICD-10-CM | POA: Diagnosis not present

## 2019-05-22 DIAGNOSIS — G360 Neuromyelitis optica [Devic]: Secondary | ICD-10-CM | POA: Diagnosis not present

## 2019-05-23 DIAGNOSIS — F411 Generalized anxiety disorder: Secondary | ICD-10-CM | POA: Diagnosis not present

## 2019-05-30 ENCOUNTER — Other Ambulatory Visit: Payer: Self-pay | Admitting: Family Medicine

## 2019-05-30 DIAGNOSIS — F329 Major depressive disorder, single episode, unspecified: Secondary | ICD-10-CM

## 2019-05-30 DIAGNOSIS — F32A Depression, unspecified: Secondary | ICD-10-CM

## 2019-06-06 DIAGNOSIS — F411 Generalized anxiety disorder: Secondary | ICD-10-CM | POA: Diagnosis not present

## 2019-08-12 ENCOUNTER — Emergency Department
Admission: EM | Admit: 2019-08-12 | Discharge: 2019-08-12 | Disposition: A | Discharge status: Home / Self Care | Payer: 59 | Attending: Emergency Medicine | Admitting: Emergency Medicine

## 2019-08-12 ENCOUNTER — Encounter: Payer: Self-pay | Admitting: Emergency Medicine

## 2019-08-12 ENCOUNTER — Other Ambulatory Visit: Payer: Self-pay

## 2019-08-12 DIAGNOSIS — Z79899 Other long term (current) drug therapy: Secondary | ICD-10-CM | POA: Insufficient documentation

## 2019-08-12 DIAGNOSIS — H6093 Unspecified otitis externa, bilateral: Secondary | ICD-10-CM | POA: Diagnosis not present

## 2019-08-12 DIAGNOSIS — R531 Weakness: Secondary | ICD-10-CM | POA: Diagnosis not present

## 2019-08-12 DIAGNOSIS — H60503 Unspecified acute noninfective otitis externa, bilateral: Secondary | ICD-10-CM | POA: Diagnosis not present

## 2019-08-12 DIAGNOSIS — H9203 Otalgia, bilateral: Secondary | ICD-10-CM | POA: Diagnosis present

## 2019-08-12 LAB — URINALYSIS, COMPLETE (UACMP) WITH MICROSCOPIC
Bilirubin Urine: NEGATIVE
Glucose, UA: NEGATIVE mg/dL
Ketones, ur: NEGATIVE mg/dL
Leukocytes,Ua: NEGATIVE
Nitrite: NEGATIVE
Protein, ur: 30 mg/dL — AB
Specific Gravity, Urine: 1.017 (ref 1.005–1.030)
pH: 5 (ref 5.0–8.0)

## 2019-08-12 LAB — CBC
HCT: 40.9 % (ref 36.0–46.0)
Hemoglobin: 12.9 g/dL (ref 12.0–15.0)
MCH: 29 pg (ref 26.0–34.0)
MCHC: 31.5 g/dL (ref 30.0–36.0)
MCV: 91.9 fL (ref 80.0–100.0)
Platelets: 138 10*3/uL — ABNORMAL LOW (ref 150–400)
RBC: 4.45 MIL/uL (ref 3.87–5.11)
RDW: 13.5 % (ref 11.5–15.5)
WBC: 4.4 10*3/uL (ref 4.0–10.5)
nRBC: 0 % (ref 0.0–0.2)

## 2019-08-12 LAB — BASIC METABOLIC PANEL
Anion gap: 6 (ref 5–15)
BUN: 13 mg/dL (ref 6–20)
CO2: 26 mmol/L (ref 22–32)
Calcium: 8.9 mg/dL (ref 8.9–10.3)
Chloride: 106 mmol/L (ref 98–111)
Creatinine, Ser: 1.03 mg/dL — ABNORMAL HIGH (ref 0.44–1.00)
GFR calc Af Amer: >60 mL/min (ref >60)
GFR calc non Af Amer: >60 mL/min (ref >60)
Glucose, Bld: 91 mg/dL (ref 70–99)
Potassium: 3.7 mmol/L (ref 3.5–5.1)
Sodium: 138 mmol/L (ref 135–145)

## 2019-08-12 LAB — POCT PREGNANCY, URINE: Preg Test, Ur: NEGATIVE

## 2019-08-12 MED ORDER — NEOMYCIN-POLYMYXIN-HC 3.5-10000-1 OT SUSP
3.0000 [drp] | Freq: Once | OTIC | Status: AC
  Administered 2019-08-12: 3 [drp] via OTIC
  Filled 2019-08-12: qty 10

## 2019-08-12 MED ORDER — NEOMYCIN-POLYMYXIN-HC 3.5-10000-1 OT SOLN: 3.0000 [drp] | Freq: Three times a day (TID) | OTIC | 0 refills | Status: AC

## 2019-08-12 NOTE — ED Triage Notes (Signed)
Pt arrives POV to triage with c/o weakness, nausea, and right side ear infection. Pt is currently receiving chemotherapy for neuromyelitis. Pt is very tearful in triage and is in obvious discomfort.

## 2019-08-12 NOTE — ED Notes (Signed)
Patient c/o bilateral ear pain, pain behind right eye, and generalized numbness.  Patient also c/o decreased urination and malaise. Patient reports only 2 voids in the last 24 hours.
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
## 2019-08-12 NOTE — ED Notes (Signed)
Reviewed discharge instructions, follow-up care, and prescriptions with patient. Patient verbalized understanding of all information reviewed. Patient stable, with no distress noted at this time.    

## 2019-08-20 ENCOUNTER — Emergency Department (HOSPITAL_COMMUNITY)
Admission: EM | Admit: 2019-08-20 | Discharge: 2019-08-20 | Disposition: A | Discharge status: Home / Self Care | Payer: 59 | Attending: Emergency Medicine | Admitting: Emergency Medicine

## 2019-08-20 ENCOUNTER — Emergency Department (HOSPITAL_COMMUNITY): Payer: 59

## 2019-08-20 ENCOUNTER — Encounter (HOSPITAL_COMMUNITY): Payer: Self-pay | Admitting: Emergency Medicine

## 2019-08-20 ENCOUNTER — Other Ambulatory Visit: Payer: Self-pay

## 2019-08-20 DIAGNOSIS — M50221 Other cervical disc displacement at C4-C5 level: Secondary | ICD-10-CM | POA: Diagnosis not present

## 2019-08-20 DIAGNOSIS — R0789 Other chest pain: Secondary | ICD-10-CM | POA: Insufficient documentation

## 2019-08-20 DIAGNOSIS — R109 Unspecified abdominal pain: Secondary | ICD-10-CM | POA: Diagnosis not present

## 2019-08-20 DIAGNOSIS — R197 Diarrhea, unspecified: Secondary | ICD-10-CM | POA: Diagnosis not present

## 2019-08-20 DIAGNOSIS — G1220 Motor neuron disease, unspecified: Secondary | ICD-10-CM | POA: Diagnosis not present

## 2019-08-20 DIAGNOSIS — M6281 Muscle weakness (generalized): Secondary | ICD-10-CM | POA: Diagnosis not present

## 2019-08-20 DIAGNOSIS — R42 Dizziness and giddiness: Secondary | ICD-10-CM | POA: Diagnosis not present

## 2019-08-20 DIAGNOSIS — Z79899 Other long term (current) drug therapy: Secondary | ICD-10-CM | POA: Diagnosis not present

## 2019-08-20 DIAGNOSIS — G360 Neuromyelitis optica [Devic]: Secondary | ICD-10-CM | POA: Insufficient documentation

## 2019-08-20 DIAGNOSIS — R1084 Generalized abdominal pain: Secondary | ICD-10-CM | POA: Diagnosis not present

## 2019-08-20 DIAGNOSIS — R110 Nausea: Secondary | ICD-10-CM | POA: Insufficient documentation

## 2019-08-20 DIAGNOSIS — M62838 Other muscle spasm: Secondary | ICD-10-CM | POA: Diagnosis not present

## 2019-08-20 DIAGNOSIS — H748X3 Other specified disorders of middle ear and mastoid, bilateral: Secondary | ICD-10-CM | POA: Diagnosis not present

## 2019-08-20 LAB — COMPREHENSIVE METABOLIC PANEL
ALT: 16 U/L (ref 0–44)
AST: 27 U/L (ref 15–41)
Albumin: 4.4 g/dL (ref 3.5–5.0)
Alkaline Phosphatase: 69 U/L (ref 38–126)
Anion gap: 14 (ref 5–15)
BUN: 10 mg/dL (ref 6–20)
CO2: 20 mmol/L — ABNORMAL LOW (ref 22–32)
Calcium: 8.9 mg/dL (ref 8.9–10.3)
Chloride: 104 mmol/L (ref 98–111)
Creatinine, Ser: 0.95 mg/dL (ref 0.44–1.00)
GFR calc Af Amer: >60 mL/min (ref >60)
GFR calc non Af Amer: >60 mL/min (ref >60)
Glucose, Bld: 137 mg/dL — ABNORMAL HIGH (ref 70–99)
Potassium: 3.3 mmol/L — ABNORMAL LOW (ref 3.5–5.1)
Sodium: 138 mmol/L (ref 135–145)
Total Bilirubin: 0.6 mg/dL (ref 0.3–1.2)
Total Protein: 7.6 g/dL (ref 6.5–8.1)

## 2019-08-20 LAB — CBC WITH DIFFERENTIAL/PLATELET
Abs Immature Granulocytes: 0.04 10*3/uL (ref 0.00–0.07)
Basophils Absolute: 0.1 10*3/uL (ref 0.0–0.1)
Basophils Relative: 1 %
Eosinophils Absolute: 0.2 10*3/uL (ref 0.0–0.5)
Eosinophils Relative: 2 %
HCT: 41.7 % (ref 36.0–46.0)
Hemoglobin: 12.9 g/dL (ref 12.0–15.0)
Immature Granulocytes: 1 %
Lymphocytes Relative: 25 %
Lymphs Abs: 1.6 10*3/uL (ref 0.7–4.0)
MCH: 28.7 pg (ref 26.0–34.0)
MCHC: 30.9 g/dL (ref 30.0–36.0)
MCV: 92.9 fL (ref 80.0–100.0)
Monocytes Absolute: 0.5 10*3/uL (ref 0.1–1.0)
Monocytes Relative: 8 %
Neutro Abs: 4.1 10*3/uL (ref 1.7–7.7)
Neutrophils Relative %: 63 %
Platelets: 175 10*3/uL (ref 150–400)
RBC: 4.49 MIL/uL (ref 3.87–5.11)
RDW: 13.3 % (ref 11.5–15.5)
WBC: 6.4 10*3/uL (ref 4.0–10.5)
nRBC: 0 % (ref 0.0–0.2)

## 2019-08-20 LAB — CBC
HCT: 41.4 % (ref 36.0–46.0)
Hemoglobin: 12.9 g/dL (ref 12.0–15.0)
MCH: 29.1 pg (ref 26.0–34.0)
MCHC: 31.2 g/dL (ref 30.0–36.0)
MCV: 93.2 fL (ref 80.0–100.0)
Platelets: 176 10*3/uL (ref 150–400)
RBC: 4.44 MIL/uL (ref 3.87–5.11)
RDW: 13.5 % (ref 11.5–15.5)
WBC: 6.3 10*3/uL (ref 4.0–10.5)
nRBC: 0 % (ref 0.0–0.2)

## 2019-08-20 LAB — I-STAT BETA HCG BLOOD, ED (MC, WL, AP ONLY): I-stat hCG, quantitative: <5 m[IU]/mL (ref <5)

## 2019-08-20 LAB — LIPASE, BLOOD: Lipase: 37 U/L (ref 11–51)

## 2019-08-20 MED ORDER — ONDANSETRON HCL 4 MG/2ML IJ SOLN
4.0000 mg | Freq: Once | INTRAMUSCULAR | Status: AC
  Administered 2019-08-20: 15:00:00 4 mg via INTRAVENOUS
  Filled 2019-08-20: qty 2

## 2019-08-20 MED ORDER — GADOBUTROL 1 MMOL/ML IV SOLN
5.1000 mL | Freq: Once | INTRAVENOUS | Status: AC | PRN
  Administered 2019-08-20: 15:00:00 5.1 mL via INTRAVENOUS

## 2019-08-20 MED ORDER — POTASSIUM CHLORIDE CRYS ER 20 MEQ PO TBCR: 20.0000 meq | EXTENDED_RELEASE_TABLET | Freq: Every day | ORAL | 0 refills | Status: AC

## 2019-08-20 MED ORDER — SODIUM CHLORIDE 0.9 % IV BOLUS
1000.0000 mL | Freq: Once | INTRAVENOUS | Status: AC
  Administered 2019-08-20: 15:00:00 1000 mL via INTRAVENOUS

## 2019-08-20 MED ORDER — ONDANSETRON 4 MG PO TBDP: 4.0000 mg | ORAL_TABLET | Freq: Three times a day (TID) | ORAL | 0 refills | Status: AC | PRN

## 2019-08-20 MED ORDER — SODIUM CHLORIDE 0.9% FLUSH: 3.0000 mL | Freq: Once | INTRAVENOUS | Status: DC

## 2019-08-20 NOTE — ED Triage Notes (Signed)
Pt here with c/o right side weakness time [redacted] week along with some cramping around her ribs ,
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
## 2019-08-20 NOTE — Discharge Instructions (Addendum)
Your potassium level was a little lower than normal.  Take the potassium supplement, as prescribed, until finished.  Have your potassium level rechecked within the next couple weeks.  Nausea/vomiting: Use the ondansetron (generic for Zofran) for nausea or vomiting.  This medication may not prevent all vomiting or nausea, but can help facilitate better hydration. Things that can help with nausea/vomiting also include peppermint/menthol candies, vitamin B12, and ginger.  Recommend with your current symptoms you follow-up with your neurologist.  Return to the emergency department for chest pain, shortness of breath, abdominal pain, passing out, dizziness, focal weakness, or any other major complaints.

## 2019-08-20 NOTE — ED Notes (Signed)
Patient Alert and oriented to baseline. Stable and ambulatory to baseline. Patient verbalized understanding of the discharge instructions.  Patient belongings were taken by the patient.   

## 2019-09-26 DIAGNOSIS — G360 Neuromyelitis optica [Devic]: Secondary | ICD-10-CM | POA: Diagnosis not present

## 2019-09-26 DIAGNOSIS — Z79899 Other long term (current) drug therapy: Secondary | ICD-10-CM | POA: Diagnosis not present

## 2019-11-13 DIAGNOSIS — Z79899 Other long term (current) drug therapy: Secondary | ICD-10-CM | POA: Diagnosis not present

## 2019-11-13 DIAGNOSIS — G360 Neuromyelitis optica [Devic]: Secondary | ICD-10-CM | POA: Diagnosis not present

## 2019-11-27 DIAGNOSIS — G360 Neuromyelitis optica [Devic]: Secondary | ICD-10-CM | POA: Diagnosis not present

## 2019-11-27 DIAGNOSIS — Z79899 Other long term (current) drug therapy: Secondary | ICD-10-CM | POA: Diagnosis not present

## 2020-06-04 IMAGING — MR MR CERVICAL SPINE WO/W CM
4 of 8 series · 19 of 48 positions shown · IV contrast (Yes MH)
Comparison: MRI of the cervical spine October 15, 2013

CLINICAL DATA: Motor neuron disease.

EXAM:
MRI CERVICAL SPINE WITHOUT AND WITH CONTRAST
TECHNIQUE: Multiplanar and multiecho pulse sequences of the cervical spine, to
include the craniocervical junction and cervicothoracic junction,
were obtained without and with intravenous contrast.
CONTRAST:  5.1mL GADAVIST GADOBUTROL 1 MMOL/ML IV SOLN

[Series 10: T2 · sagittal · 3.0mm · 0.43mm/px · 3 of 16 slices shown (1 of 2)]
[im 1/16]
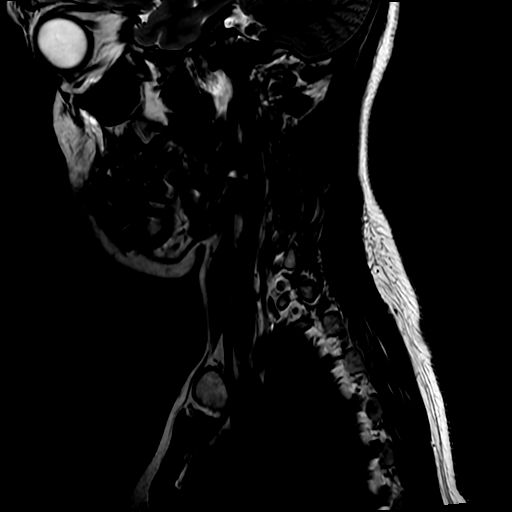
[im 8/16]
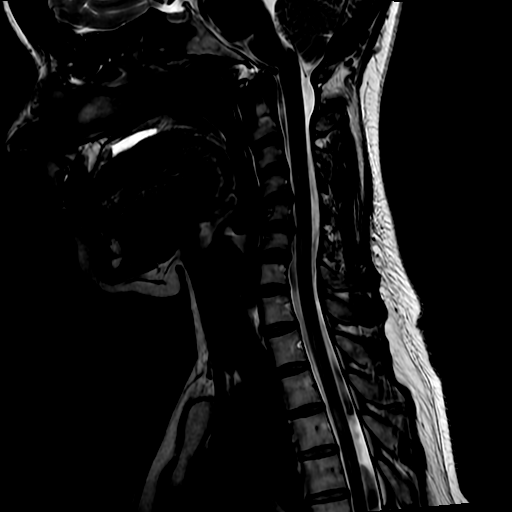
[im 16/16]
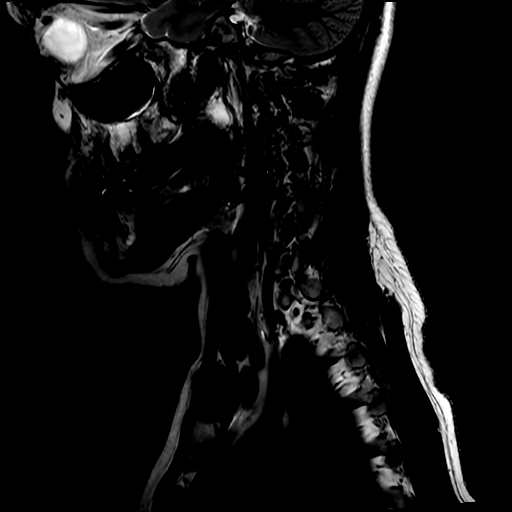

[Series 14: T2 · axial · 3.0mm · 0.35mm/px · z∈[-183,-97]mm · 6 of 26 slices shown (2 of 2)]
[im 1/26]
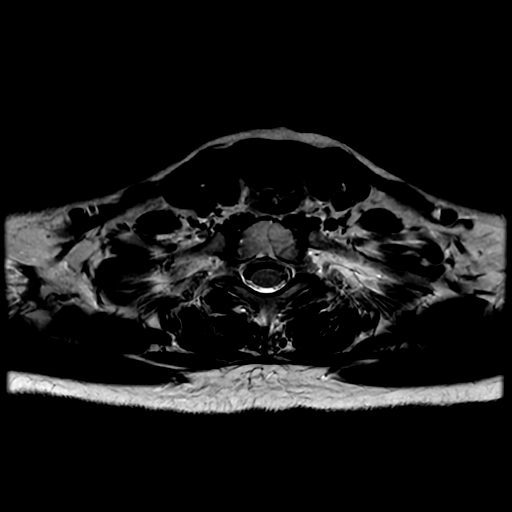
[im 6/26]
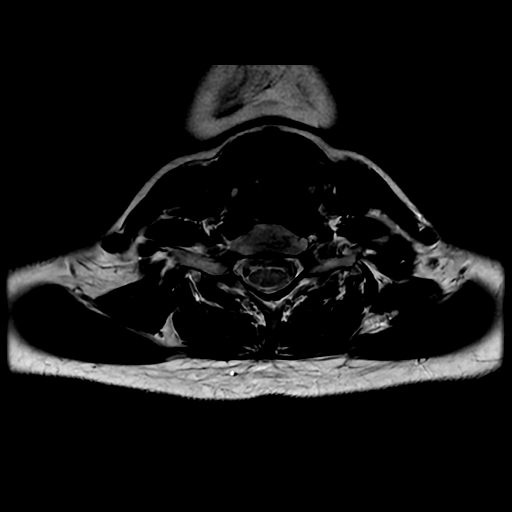
[im 11/26]
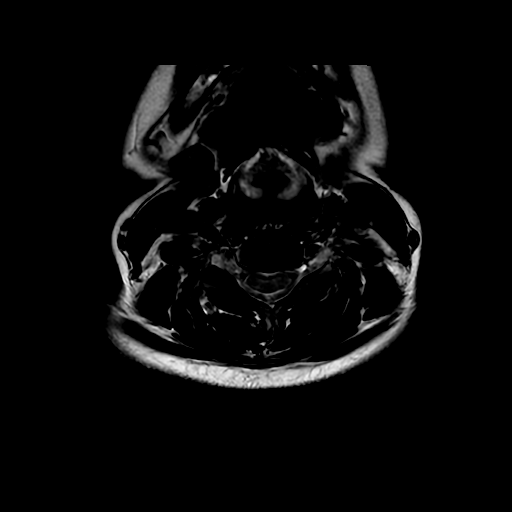
[im 16/26]
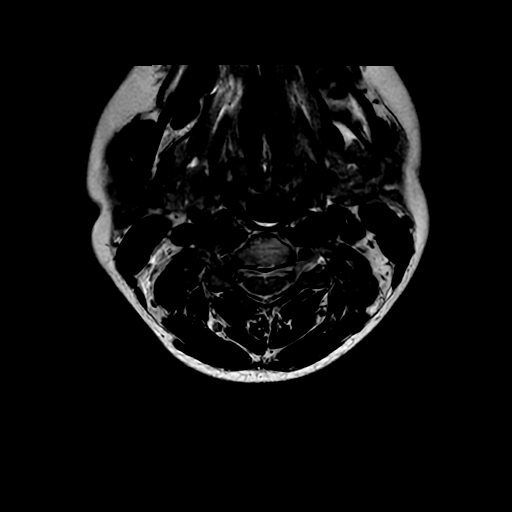
[im 21/26]
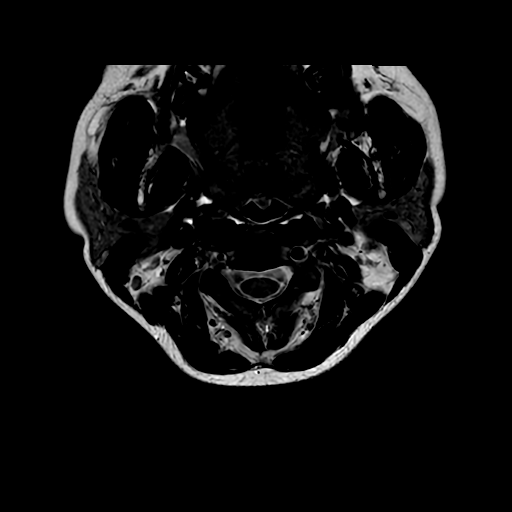
[im 26/26]
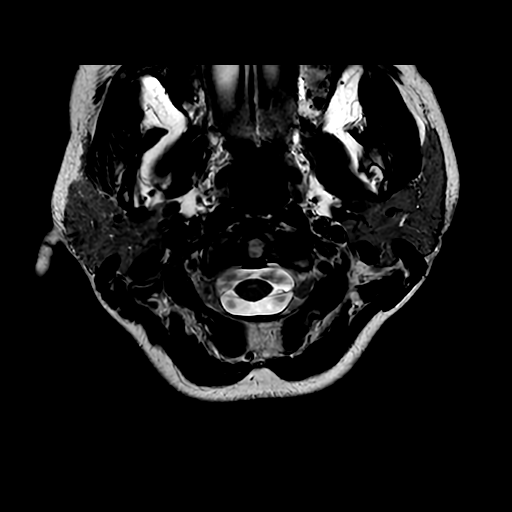

[Series 15: T1 · axial · non-contrast · 3.0mm · 0.35mm/px · z∈[-183,-97]mm · 6 of 26 slices shown]
[im 1/26]
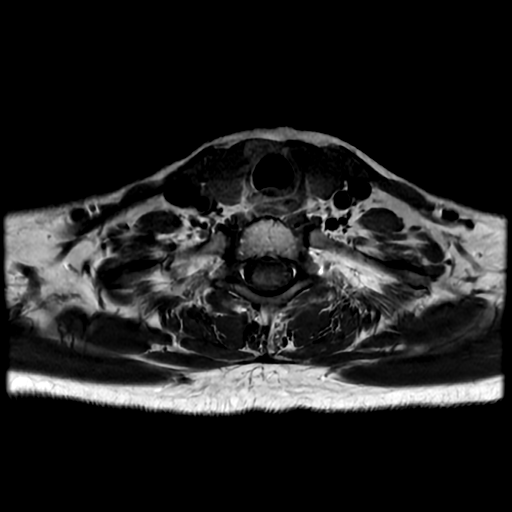
[im 6/26]
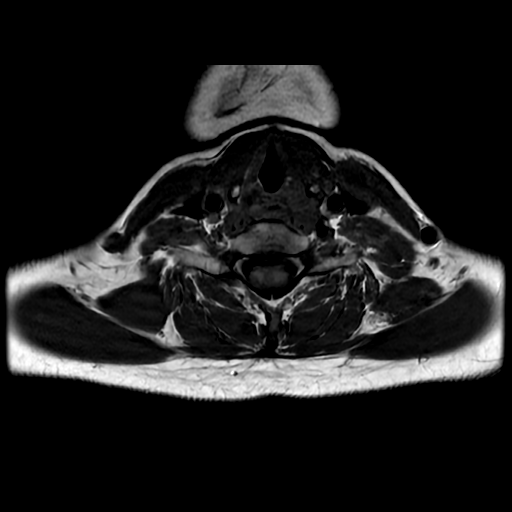
[im 11/26]
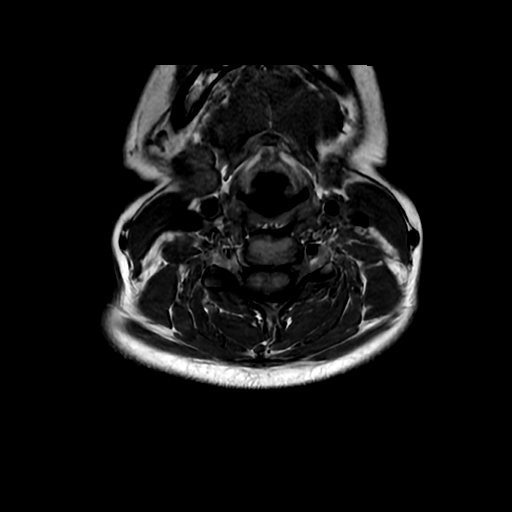
[im 16/26]
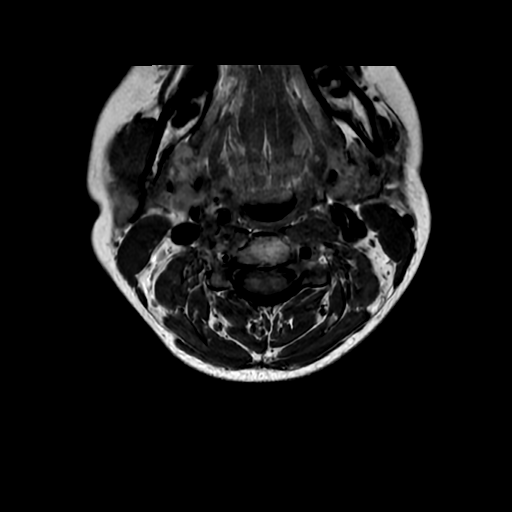
[im 21/26]
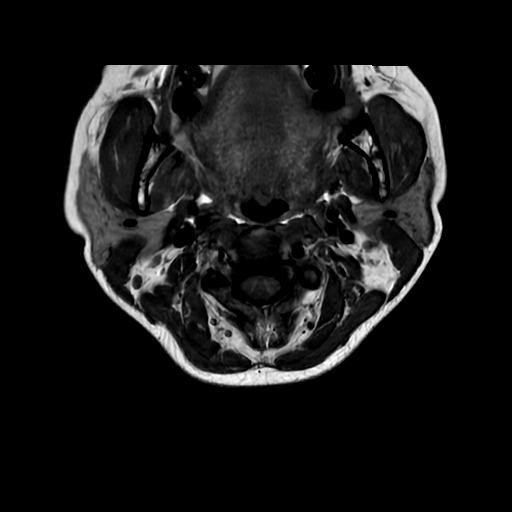
[im 26/26]
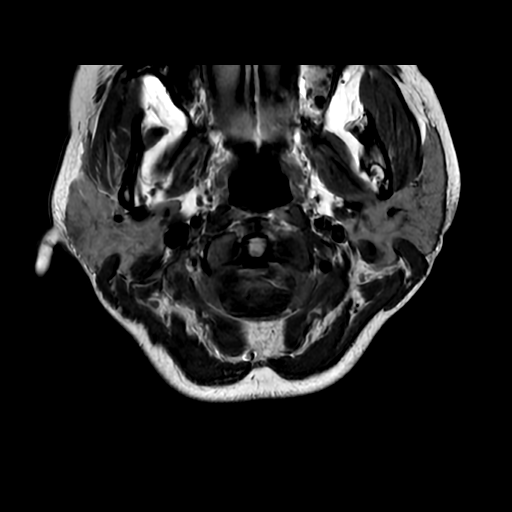

[Series 19: T1 fat-sat post-contrast · sagittal · 3.0mm · 0.43mm/px · 4 of 16 slices shown]
[im 1/16]
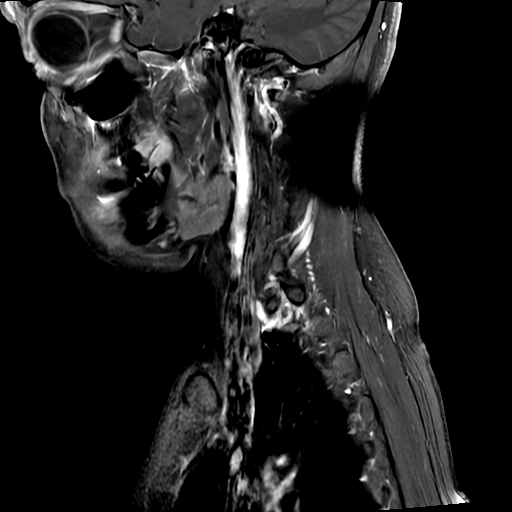
[im 6/16]
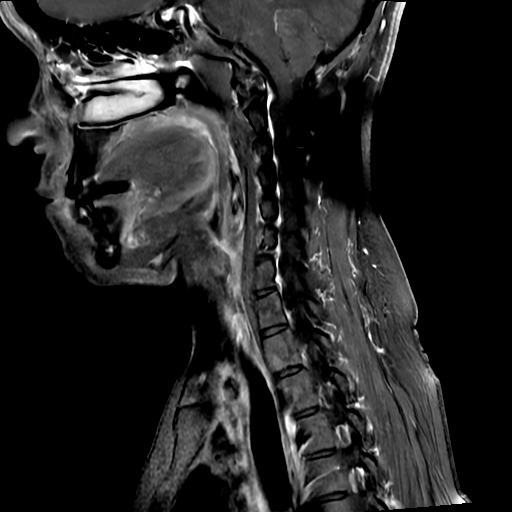
[im 11/16]
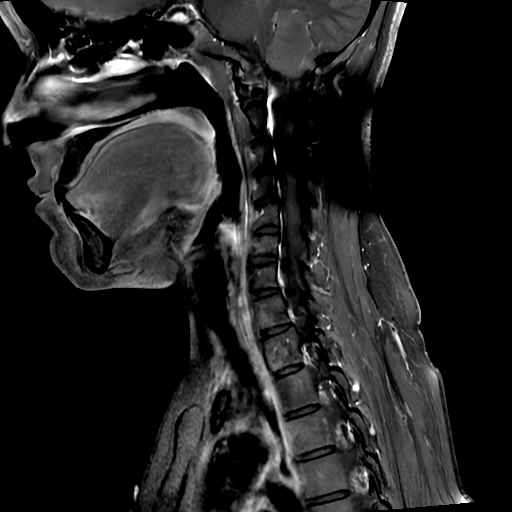
[im 16/16]
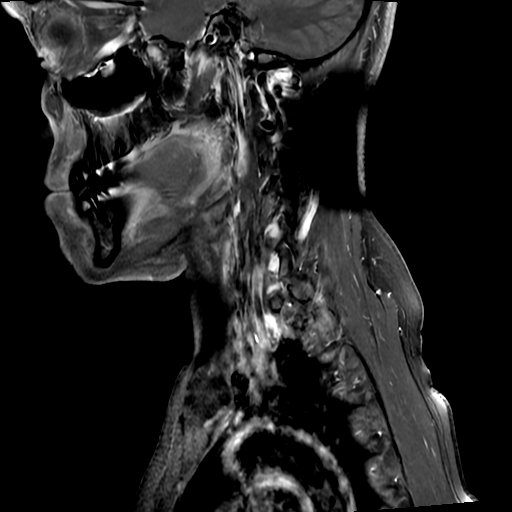

[19 of 48 positions shown; findings below may reference images not displayed]

FINDINGS: Alignment: Reversal of the cervical curvature.

Vertebrae: No fracture, evidence of discitis, or bone lesion.

Cord: Normal signal and morphology.

Posterior Fossa, vertebral arteries, paraspinal tissues: Negative.

Disc levels:

Tiny posterior disc protrusions at C4-5 and C5-6. No significant
spinal canal or neural foraminal stenosis at any level.
IMPRESSION: 1. No spinal cord lesion identified.
2. No significant spinal canal or neural foraminal stenosis at any
level.

## 2020-09-25 ENCOUNTER — Ambulatory Visit (HOSPITAL_BASED_OUTPATIENT_CLINIC_OR_DEPARTMENT_OTHER): Payer: 59 | Admitting: Nurse Practitioner

## 2020-10-10 ENCOUNTER — Ambulatory Visit (HOSPITAL_BASED_OUTPATIENT_CLINIC_OR_DEPARTMENT_OTHER): Payer: 59 | Admitting: Nurse Practitioner

## 2020-10-10 ENCOUNTER — Encounter (HOSPITAL_BASED_OUTPATIENT_CLINIC_OR_DEPARTMENT_OTHER): Payer: Self-pay

## 2021-11-03 ENCOUNTER — Encounter: Payer: Self-pay | Admitting: *Deleted
